# Patient Record
Sex: Female | Born: 1955 | Race: White | Hispanic: No | Marital: Married | Smoking: Current every day smoker
Health system: Southern US, Community
[De-identification: ages and names within clinical notes are randomized; demographics above are authoritative.]

## PROBLEM LIST (undated history)

## (undated) DIAGNOSIS — Z6841 Body Mass Index (BMI) 40.0 and over, adult: Secondary | ICD-10-CM

## (undated) DIAGNOSIS — F4321 Adjustment disorder with depressed mood: Secondary | ICD-10-CM

## (undated) DIAGNOSIS — Z8701 Personal history of pneumonia (recurrent): Secondary | ICD-10-CM

## (undated) DIAGNOSIS — I5032 Chronic diastolic (congestive) heart failure: Secondary | ICD-10-CM

## (undated) DIAGNOSIS — R0683 Snoring: Secondary | ICD-10-CM

## (undated) HISTORY — DX: Snoring: R06.83

## (undated) HISTORY — DX: Body Mass Index (BMI) 40.0 and over, adult: Z684

## (undated) HISTORY — DX: Chronic diastolic (congestive) heart failure: I50.32

## (undated) HISTORY — DX: Adjustment disorder with depressed mood: F43.21

## (undated) HISTORY — DX: Personal history of pneumonia (recurrent): Z87.01

---

## 2017-04-08 DIAGNOSIS — E559 Vitamin D deficiency, unspecified: Secondary | ICD-10-CM | POA: Diagnosis not present

## 2017-04-08 DIAGNOSIS — Z23 Encounter for immunization: Secondary | ICD-10-CM | POA: Diagnosis not present

## 2017-04-08 DIAGNOSIS — E782 Mixed hyperlipidemia: Secondary | ICD-10-CM | POA: Diagnosis not present

## 2017-04-08 DIAGNOSIS — Z6835 Body mass index (BMI) 35.0-35.9, adult: Secondary | ICD-10-CM | POA: Diagnosis not present

## 2017-04-08 DIAGNOSIS — Z1389 Encounter for screening for other disorder: Secondary | ICD-10-CM | POA: Diagnosis not present

## 2017-04-08 DIAGNOSIS — Z0000 Encounter for general adult medical examination without abnormal findings: Secondary | ICD-10-CM | POA: Diagnosis not present

## 2017-04-08 DIAGNOSIS — E119 Type 2 diabetes mellitus without complications: Secondary | ICD-10-CM | POA: Diagnosis not present

## 2017-04-09 DIAGNOSIS — E119 Type 2 diabetes mellitus without complications: Secondary | ICD-10-CM | POA: Diagnosis not present

## 2017-04-09 DIAGNOSIS — Z0000 Encounter for general adult medical examination without abnormal findings: Secondary | ICD-10-CM | POA: Diagnosis not present

## 2017-04-09 DIAGNOSIS — E782 Mixed hyperlipidemia: Secondary | ICD-10-CM | POA: Diagnosis not present

## 2017-04-09 DIAGNOSIS — E559 Vitamin D deficiency, unspecified: Secondary | ICD-10-CM | POA: Diagnosis not present

## 2017-07-24 DIAGNOSIS — Z1231 Encounter for screening mammogram for malignant neoplasm of breast: Secondary | ICD-10-CM | POA: Diagnosis not present

## 2017-09-04 DIAGNOSIS — H68003 Unspecified Eustachian salpingitis, bilateral: Secondary | ICD-10-CM | POA: Diagnosis not present

## 2017-09-04 DIAGNOSIS — J309 Allergic rhinitis, unspecified: Secondary | ICD-10-CM | POA: Diagnosis not present

## 2017-09-04 DIAGNOSIS — H109 Unspecified conjunctivitis: Secondary | ICD-10-CM | POA: Diagnosis not present

## 2017-09-04 DIAGNOSIS — H81319 Aural vertigo, unspecified ear: Secondary | ICD-10-CM | POA: Diagnosis not present

## 2017-09-18 DIAGNOSIS — Z79899 Other long term (current) drug therapy: Secondary | ICD-10-CM | POA: Diagnosis not present

## 2017-09-18 DIAGNOSIS — E119 Type 2 diabetes mellitus without complications: Secondary | ICD-10-CM | POA: Diagnosis not present

## 2017-09-18 DIAGNOSIS — E559 Vitamin D deficiency, unspecified: Secondary | ICD-10-CM | POA: Diagnosis not present

## 2017-09-18 DIAGNOSIS — E669 Obesity, unspecified: Secondary | ICD-10-CM | POA: Diagnosis not present

## 2017-09-18 DIAGNOSIS — I10 Essential (primary) hypertension: Secondary | ICD-10-CM | POA: Diagnosis not present

## 2017-09-18 DIAGNOSIS — Z6835 Body mass index (BMI) 35.0-35.9, adult: Secondary | ICD-10-CM | POA: Diagnosis not present

## 2019-01-05 DIAGNOSIS — Z0001 Encounter for general adult medical examination with abnormal findings: Secondary | ICD-10-CM | POA: Diagnosis not present

## 2019-01-05 DIAGNOSIS — Z0000 Encounter for general adult medical examination without abnormal findings: Secondary | ICD-10-CM | POA: Diagnosis not present

## 2019-01-05 DIAGNOSIS — Z1331 Encounter for screening for depression: Secondary | ICD-10-CM | POA: Diagnosis not present

## 2019-01-05 DIAGNOSIS — Z1231 Encounter for screening mammogram for malignant neoplasm of breast: Secondary | ICD-10-CM | POA: Diagnosis not present

## 2019-01-05 DIAGNOSIS — F17210 Nicotine dependence, cigarettes, uncomplicated: Secondary | ICD-10-CM | POA: Diagnosis not present

## 2019-01-05 DIAGNOSIS — H1013 Acute atopic conjunctivitis, bilateral: Secondary | ICD-10-CM | POA: Diagnosis not present

## 2019-01-30 DIAGNOSIS — H01023 Squamous blepharitis right eye, unspecified eyelid: Secondary | ICD-10-CM | POA: Diagnosis not present

## 2019-02-13 DIAGNOSIS — H01023 Squamous blepharitis right eye, unspecified eyelid: Secondary | ICD-10-CM | POA: Diagnosis not present

## 2019-02-23 DIAGNOSIS — E039 Hypothyroidism, unspecified: Secondary | ICD-10-CM | POA: Diagnosis not present

## 2019-03-18 DIAGNOSIS — Z1231 Encounter for screening mammogram for malignant neoplasm of breast: Secondary | ICD-10-CM | POA: Diagnosis not present

## 2019-03-18 DIAGNOSIS — N959 Unspecified menopausal and perimenopausal disorder: Secondary | ICD-10-CM | POA: Diagnosis not present

## 2019-03-18 DIAGNOSIS — M8589 Other specified disorders of bone density and structure, multiple sites: Secondary | ICD-10-CM | POA: Diagnosis not present

## 2019-04-07 DIAGNOSIS — H0100B Unspecified blepharitis left eye, upper and lower eyelids: Secondary | ICD-10-CM | POA: Diagnosis not present

## 2019-04-07 DIAGNOSIS — Z6832 Body mass index (BMI) 32.0-32.9, adult: Secondary | ICD-10-CM | POA: Diagnosis not present

## 2019-04-07 DIAGNOSIS — E785 Hyperlipidemia, unspecified: Secondary | ICD-10-CM | POA: Diagnosis not present

## 2019-04-07 DIAGNOSIS — E039 Hypothyroidism, unspecified: Secondary | ICD-10-CM | POA: Diagnosis not present

## 2019-04-24 DIAGNOSIS — H5203 Hypermetropia, bilateral: Secondary | ICD-10-CM | POA: Diagnosis not present

## 2019-07-06 ENCOUNTER — Encounter: Payer: Self-pay | Admitting: Gastroenterology

## 2020-07-22 DIAGNOSIS — E039 Hypothyroidism, unspecified: Secondary | ICD-10-CM | POA: Diagnosis not present

## 2020-07-22 DIAGNOSIS — L853 Xerosis cutis: Secondary | ICD-10-CM | POA: Diagnosis not present

## 2020-07-22 DIAGNOSIS — E785 Hyperlipidemia, unspecified: Secondary | ICD-10-CM | POA: Diagnosis not present

## 2020-07-22 DIAGNOSIS — H1013 Acute atopic conjunctivitis, bilateral: Secondary | ICD-10-CM | POA: Diagnosis not present

## 2020-08-12 ENCOUNTER — Ambulatory Visit: Payer: BC Managed Care – PPO | Admitting: Sports Medicine

## 2020-08-12 ENCOUNTER — Other Ambulatory Visit: Payer: Self-pay

## 2020-08-12 ENCOUNTER — Encounter: Payer: Self-pay | Admitting: Sports Medicine

## 2020-08-12 DIAGNOSIS — L989 Disorder of the skin and subcutaneous tissue, unspecified: Secondary | ICD-10-CM | POA: Diagnosis not present

## 2020-08-12 DIAGNOSIS — B070 Plantar wart: Secondary | ICD-10-CM | POA: Diagnosis not present

## 2020-08-12 DIAGNOSIS — Q828 Other specified congenital malformations of skin: Secondary | ICD-10-CM

## 2020-08-12 DIAGNOSIS — M79671 Pain in right foot: Secondary | ICD-10-CM | POA: Diagnosis not present

## 2020-08-12 NOTE — Progress Notes (Signed)
Subjective: Lisa Navarro is a 65 y.o. female patient who presents to office for evaluation of Right foot pain that started after she stepped on a splinter and her husband pulled it out and afterwards developed a area of callus.  Patient reports that pain is worse when she has to walk at work and work her shift of more than 10 to 12 hours reports that she also needs new shoes.  Denies any redness warmth swelling or drainage from the area.  Admits to a history of hammertoes.  No other pedal complaints noted.  Review of systems noncontributory.  There are no problems to display for this patient.   Current Outpatient Medications on File Prior to Visit  Medication Sig Dispense Refill  . Atorvastatin Calcium (LIPITOR PO) Take by mouth.    . Montelukast Sodium (SINGULAIR PO) Take by mouth.    Marland Kitchen azelastine (OPTIVAR) 0.05 % ophthalmic solution Apply 1 drop to eye 2 (two) times daily.    Marland Kitchen levothyroxine (SYNTHROID) 150 MCG tablet Take 150 mcg by mouth every morning.     No current facility-administered medications on file prior to visit.    Allergies  Allergen Reactions  . Sulfa Antibiotics     Objective:  General: Alert and oriented x3 in no acute distress  Dermatology: Keratotic lesion present measuring less than 0.3 cm at ball of the right foot subsecond metatarsal with a nucleated core, pain is present with medial lateral pressure to the lesion, no capillaries or pin point bleeding noted, no webspace macerations, no ecchymosis bilateral, all nails x 10 are well manicured.  Vascular: Dorsalis Pedis and Posterior Tibial pedal pulses 1/4, Capillary Fill Time 3 seconds, no pedal hair growth bilateral, no edema bilateral lower extremities, Temperature gradient within normal limits.  Neurology: Gross sensation intact via light touch bilateral.  Musculoskeletal: Mild tenderness with palpation at the lesion site on Right, Muscular strength 5/5 in all groups without pain or limitation on range of  motion.  Hammertoes x10 bilateral.  Cavus foot type.  Assessment and Plan: Problem List Items Addressed This Visit   None   Visit Diagnoses    Porokeratosis    -  Primary   Benign skin lesion       Plantar wart       Right foot pain          -Complete examination performed -Discussed treatment options for keratosis which could represent a traumatized wart -Parred keratoic warty lesion using a chisel blade; treated the area with Catharidin covered with bandaid; Advised patient of blistering reaction that will occur from application of medication and once this happens replace bandaid with neosporin and tape/bandaid -Applied offloading padding to right shoe -Advised patient to get new tennis shoes -Advised patient if symptoms are doing better next visit we will discuss need for custom orthotics -Patient to return to office in 1 month or sooner if condition worsens.  Asencion Islam, DPM

## 2020-08-23 DIAGNOSIS — Z1231 Encounter for screening mammogram for malignant neoplasm of breast: Secondary | ICD-10-CM | POA: Diagnosis not present

## 2020-08-31 DIAGNOSIS — Z23 Encounter for immunization: Secondary | ICD-10-CM | POA: Diagnosis not present

## 2020-09-14 ENCOUNTER — Other Ambulatory Visit: Payer: Self-pay

## 2020-09-14 ENCOUNTER — Encounter: Payer: Self-pay | Admitting: Sports Medicine

## 2020-09-14 ENCOUNTER — Ambulatory Visit (INDEPENDENT_AMBULATORY_CARE_PROVIDER_SITE_OTHER): Payer: BC Managed Care – PPO | Admitting: Sports Medicine

## 2020-09-14 ENCOUNTER — Ambulatory Visit: Payer: BC Managed Care – PPO | Admitting: Sports Medicine

## 2020-09-14 DIAGNOSIS — Q828 Other specified congenital malformations of skin: Secondary | ICD-10-CM

## 2020-09-14 DIAGNOSIS — L989 Disorder of the skin and subcutaneous tissue, unspecified: Secondary | ICD-10-CM

## 2020-09-14 DIAGNOSIS — M79671 Pain in right foot: Secondary | ICD-10-CM

## 2020-09-14 DIAGNOSIS — B070 Plantar wart: Secondary | ICD-10-CM

## 2020-09-14 NOTE — Progress Notes (Signed)
Subjective: Lisa Navarro is a 65 y.o. female patient who returns to office for evaluation of Right foot pain at area of keratosis.  Patient reports that she did have pain for the first few days but then the pain came back sharp shooting with pressure to the ball of the foot.  Reports that the padding tend to move around so she had a hard time using it.   There are no problems to display for this patient.   Current Outpatient Medications on File Prior to Visit  Medication Sig Dispense Refill  . Atorvastatin Calcium (LIPITOR PO) Take by mouth.    Marland Kitchen azelastine (OPTIVAR) 0.05 % ophthalmic solution Apply 1 drop to eye 2 (two) times daily.    Marland Kitchen levothyroxine (SYNTHROID) 150 MCG tablet Take 150 mcg by mouth every morning.    . Montelukast Sodium (SINGULAIR PO) Take by mouth.     No current facility-administered medications on file prior to visit.    Allergies  Allergen Reactions  . Sulfa Antibiotics     Objective:  General: Alert and oriented x3 in no acute distress  Dermatology: Keratotic lesion present measuring less than 0.3 cm at ball of the right foot subsecond metatarsal with a nucleated core, pain is present with medial lateral pressure to the lesion, no capillaries or pin point bleeding noted, no webspace macerations, no ecchymosis bilateral, all nails x 10 are well manicured.  Vascular: Dorsalis Pedis and Posterior Tibial pedal pulses 1/4, Capillary Fill Time 3 seconds, no pedal hair growth bilateral, no edema bilateral lower extremities, Temperature gradient within normal limits.  Neurology: Gross sensation intact via light touch bilateral.  Musculoskeletal: Mild tenderness with palpation at the lesion site on Right, Muscular strength 5/5 in all groups without pain or limitation on range of motion.  Hammertoes x10 bilateral.  Cavus foot type.  Assessment and Plan: Problem List Items Addressed This Visit   None   Visit Diagnoses    Porokeratosis    -  Primary   Benign skin  lesion       Plantar wart       Right foot pain          -Complete examination performed -Re-Discussed treatment options for keratosis which could represent a traumatized wart -Parred keratoic warty lesion using a chisel blade; treated the area with Catharidin covered with bandaid; Advised patient of blistering reaction that will occur from application of medication and once this happens replace bandaid with neosporin and tape/bandaid. This is treatment #2 -Continue with offloading padding may consider orthotics if pain is better -Patient to return to office in 1 month or sooner if condition worsens.  Asencion Islam, DPM

## 2020-09-14 NOTE — Patient Instructions (Signed)

## 2021-01-19 DIAGNOSIS — E559 Vitamin D deficiency, unspecified: Secondary | ICD-10-CM | POA: Diagnosis not present

## 2021-01-19 DIAGNOSIS — J449 Chronic obstructive pulmonary disease, unspecified: Secondary | ICD-10-CM | POA: Diagnosis not present

## 2021-01-19 DIAGNOSIS — E039 Hypothyroidism, unspecified: Secondary | ICD-10-CM | POA: Diagnosis not present

## 2021-01-19 DIAGNOSIS — E785 Hyperlipidemia, unspecified: Secondary | ICD-10-CM | POA: Diagnosis not present

## 2021-01-19 DIAGNOSIS — Z0000 Encounter for general adult medical examination without abnormal findings: Secondary | ICD-10-CM | POA: Diagnosis not present

## 2021-01-19 DIAGNOSIS — F17210 Nicotine dependence, cigarettes, uncomplicated: Secondary | ICD-10-CM | POA: Diagnosis not present

## 2021-03-01 DIAGNOSIS — Z6835 Body mass index (BMI) 35.0-35.9, adult: Secondary | ICD-10-CM | POA: Diagnosis not present

## 2021-03-01 DIAGNOSIS — J449 Chronic obstructive pulmonary disease, unspecified: Secondary | ICD-10-CM | POA: Diagnosis not present

## 2021-03-01 DIAGNOSIS — F17210 Nicotine dependence, cigarettes, uncomplicated: Secondary | ICD-10-CM | POA: Diagnosis not present

## 2021-06-26 DIAGNOSIS — J441 Chronic obstructive pulmonary disease with (acute) exacerbation: Secondary | ICD-10-CM | POA: Diagnosis not present

## 2021-06-26 DIAGNOSIS — F17219 Nicotine dependence, cigarettes, with unspecified nicotine-induced disorders: Secondary | ICD-10-CM | POA: Diagnosis not present

## 2021-06-28 DIAGNOSIS — J441 Chronic obstructive pulmonary disease with (acute) exacerbation: Secondary | ICD-10-CM | POA: Diagnosis not present

## 2021-06-28 DIAGNOSIS — J849 Interstitial pulmonary disease, unspecified: Secondary | ICD-10-CM | POA: Diagnosis not present

## 2021-06-28 DIAGNOSIS — J449 Chronic obstructive pulmonary disease, unspecified: Secondary | ICD-10-CM | POA: Diagnosis not present

## 2021-06-28 DIAGNOSIS — F17219 Nicotine dependence, cigarettes, with unspecified nicotine-induced disorders: Secondary | ICD-10-CM | POA: Diagnosis not present

## 2021-07-03 DIAGNOSIS — F17219 Nicotine dependence, cigarettes, with unspecified nicotine-induced disorders: Secondary | ICD-10-CM | POA: Diagnosis not present

## 2021-07-03 DIAGNOSIS — J849 Interstitial pulmonary disease, unspecified: Secondary | ICD-10-CM | POA: Diagnosis not present

## 2021-07-03 DIAGNOSIS — J441 Chronic obstructive pulmonary disease with (acute) exacerbation: Secondary | ICD-10-CM | POA: Diagnosis not present

## 2021-07-18 DIAGNOSIS — H65191 Other acute nonsuppurative otitis media, right ear: Secondary | ICD-10-CM | POA: Diagnosis not present

## 2021-07-18 DIAGNOSIS — F17219 Nicotine dependence, cigarettes, with unspecified nicotine-induced disorders: Secondary | ICD-10-CM | POA: Diagnosis not present

## 2021-07-18 DIAGNOSIS — J849 Interstitial pulmonary disease, unspecified: Secondary | ICD-10-CM | POA: Diagnosis not present

## 2021-07-18 DIAGNOSIS — J449 Chronic obstructive pulmonary disease, unspecified: Secondary | ICD-10-CM | POA: Diagnosis not present

## 2021-07-27 ENCOUNTER — Ambulatory Visit: Payer: BC Managed Care – PPO | Admitting: Internal Medicine

## 2021-07-27 ENCOUNTER — Encounter: Payer: Self-pay | Admitting: Internal Medicine

## 2021-07-27 ENCOUNTER — Encounter: Payer: Self-pay | Admitting: *Deleted

## 2021-07-27 ENCOUNTER — Other Ambulatory Visit: Payer: Self-pay

## 2021-07-27 VITALS — BP 118/70 | HR 89 | Temp 98.0°F | Ht 65.5 in | Wt 226.8 lb

## 2021-07-27 DIAGNOSIS — R053 Chronic cough: Secondary | ICD-10-CM | POA: Diagnosis not present

## 2021-07-27 DIAGNOSIS — F17210 Nicotine dependence, cigarettes, uncomplicated: Secondary | ICD-10-CM | POA: Diagnosis not present

## 2021-07-27 DIAGNOSIS — R0609 Other forms of dyspnea: Secondary | ICD-10-CM | POA: Diagnosis not present

## 2021-07-27 DIAGNOSIS — Z8709 Personal history of other diseases of the respiratory system: Secondary | ICD-10-CM

## 2021-07-27 DIAGNOSIS — E669 Obesity, unspecified: Secondary | ICD-10-CM

## 2021-07-27 NOTE — Patient Instructions (Addendum)
ICD-10-CM   1. DOE (dyspnea on exertion)  R06.09     2. Chronic cough  R05.3     3. Smoking greater than 40 pack years  F17.210     4. History of acute bronchitis  Z87.09     5. 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

## 2021-07-27 NOTE — Progress Notes (Signed)
OV 07/27/2021  Subjective:  Patient ID: Lisa Navarro, female , DOB: 1955/10/22 , age 66 y.o. , MRN: [id number] , ADDRESS: [address] [place] [address] PCP Madison Hickman, FNP Patient Care Team: Madison Hickman, FNP as PCP - General (Family Medicine)  This Provider for this visit: Treatment Team:  Attending Provider: Brand Males, MD    07/27/2021 -   Chief Complaint  Patient presents with   Consult    Pt is here for eval of ILD/COPD.  Pt states that she has complaints of SOB that states can happen at anytime.     HPI Lisa Navarro 66 y.o. -it is unclear if she has ILD but she has been referred here because of chronic shortness of breath all made worse by recent respiratory viral infection.  She works in Psychologist, counselling.  She has to walk fair amount of distances.  Each distance that is greater than 150 feet.  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  She also is a chronic cough.  She is put this down to being obese, older than 64 years of age and also smoking history.  She is never really paid attention to this.  In the summer 2022 her physician gave her a inhaler but she did not use it.  Then around Thanksgiving 2022 she developed high fever and also worsening cough.  She rested and then got better but then got worse again.  Around June 16, 2021 she tested herself for COVID at [facility] and this was negative.  Then she started getting worse.  June 26, 2021 she saw primary care physician and was given 10 days of Augmentin and 5 days of prednisone.  After that she is significantly better.  Since then she has been taking Trelegy regularly.  Apparently her pulse ox was 60% on the finger on June 26, 2021.  Therefore primary care physician took her out of work.  Started on home oxygen.  She says she is substantially improved and almost back to baseline.  She  stopped using oxygen few weeks ago.  This is based on subjective response.  Here walking desaturation test she is shows a tendency to desaturate but is not really gone below 90%.  This is adequate.  She really wants to go back to work.  Currently when we did walking desaturation test she denied any chest pain or dizziness or diplopia or palpitations.  She only had some shortness of breath.    Symptoms:  SYMPTOM SCALE - ILD 07/27/2021  Current weight   O2 use ra  Shortness of Breath 0 -> 5 scale with 5 being worst (score 6 If unable to do)  At rest 3  Simple tasks - showers, clothes change, eating, shaving 3  Household (dishes, doing bed, laundry) 4  Shopping 4  Walking level at own pace 3  Walking up Stairs 6  Total (30-36) Dyspnea Score 23  How bad is your cough? 3  How bad is your fatigue 4  How bad is nausea 3  How bad is vomiting?  0  How bad is diarrhea? 0  How bad is anxiety? 0  How bad is depression 00  Any chronic pain - if so where and how bad 0       Past Medical History :  -Obesity Smoker - She believes she  might have COPD/asthma - History of pneumonia in 2015 - She did have COVID in October 2021.  She is fully vaccinated against COVID   ROS:  Review of system positive for shortness of breath and snoring and obesity  FAMILY HISTORY of LUNG DISEASE:  --Her sister has COPD.  Her brother has premature graying of the hair  PERSONAL EXPOSURE HISTORY:  -She has been smoking since 21 7520-25 cigarettes/day.  She still smokes.  Struggle to quit.  Never used marijuana.  No cocaine no intravenous drug use  HOME  EXPOSURE and HOBBY DETAILS :  [place]  Age of the current home is built in 7.  She does have a humidifier but no CPAP.  She does have a nebulizer machine.  Is no mold in it.  There is no mold in the house.  OCCUPATIONAL HISTORY (122 questions) : -She works in a Retail banker.   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  The production manufacturing plants that she is working have been dusty  PULMONARY TOXICITY HISTORY (27 items):  -None  INVESTIGATIONS: Simple office walk 185 feet x  3 laps goal with forehead probe 07/27/2021    O2 used ra   Number laps completed 3   Comments about pace avg   Resting Pulse Ox/HR 97% and 89/min   Final Pulse Ox/HR 90% and 122/min   Desaturated </= 88% no   Desaturated <= 3% points Yes, 7 points   Got Tachycardic >/= 90/min yes   Symptoms at end of test Mod dyspnea   Miscellaneous comments x       CT Chest data  No results found.    PFT  No flowsheet data found.     has no past medical history on file.   reports that she has been smoking cigarettes. She started smoking about 50 years ago. She has a 120.00 pack-year smoking history. She has never used smokeless tobacco.  No past surgical history on file.  Allergies  Allergen Reactions   Sulfa Antibiotics     Immunization History  Administered Date(s) Administered   Influenza, High Dose Seasonal PF 06/07/2021   Influenza-Unspecified 03/16/2018   Moderna Sars-Covid-2 Vaccination 09/25/2019, 10/21/2019, 08/31/2020    No family history on file.   Current Outpatient Medications:    Atorvastatin Calcium (LIPITOR PO), Take by mouth., Disp: , Rfl:    ipratropium-albuterol (DUONEB) 0.5-2.5 (3) MG/3ML SOLN, Take 3 mLs by nebulization every 4 (four) hours as needed., Disp: , Rfl:    levothyroxine (SYNTHROID) 150 MCG tablet, Take 150 mcg by mouth every morning., Disp: , Rfl:    Montelukast Sodium (SINGULAIR PO), Take by mouth., Disp: , Rfl:    TRELEGY ELLIPTA 100-62.5-25 MCG/ACT AEPB, Inhale 1 puff into the lungs daily., Disp: , Rfl:       Objective:   Vitals:   07/27/21 1528  BP: 118/70  Pulse: 89  Temp: 98 F (36.7 C)  TempSrc: Oral   SpO2: 97%  Weight: 226 lb 12.8 oz (102.9 kg)  Height: 5' 5.5" (1.664 m)    Estimated body mass index is 37.17 kg/m as calculated from the following:   Height as of this encounter: 5' 5.5" (1.664 m).   Weight as of this encounter: 226 lb 12.8 oz (102.9 kg).  @WEIGHTCHANGE @  [employer]  Weights   07/27/21 1528  Weight: 226 lb 12.8 oz (102.9 kg)     Physical Exam   General: No distress. Looks well. Obese smells of tobacco Neuro: Alert and Oriented x 3. GCS 15. Speech normal Psych: Pleasant Resp:  Barrel Chest - no.  Wheeze - no, Crackles - no, No overt respiratory distress CVS: Normal heart sounds. Murmurs - no Ext: Stigmata of Connective Tissue Disease - no HEENT: Normal upper airway. PEERL +. No post nasal drip        Assessment:       ICD-10-CM   1. DOE (dyspnea on exertion)  R06.09 CT Chest High Resolution    Pulmonary function test    ECHOCARDIOGRAM COMPLETE    2. Chronic cough  R05.3     3. Smoking greater than 40 pack years  F17.210     4. History of acute bronchitis  Z87.09     5. Obesity (BMI 30-39.9)  E66.9      Clinically she appears to be more at risk for COPD than evidence of pulmonary fibrosis but both conditions need to be ruled out.  Also she is eligible for lung cancer screening.  Therefore we will get a CT scan PFTs.  Her sister has COPD.  At follow-up we will even get an alpha-1 antitrypsin in case this emphysema diagnosed on CT and PFT.  Because she was able to walk 150 feet x 3 laps on room air without pulse ox dropping below 88% and did not have any symptoms other than her baseline shortness of breath I believe she can go back to work with self monitoring.  I.  I advised her to get a pulse ox monitor to self monitor and regulate herself.    Plan:     Patient Instructions     ICD-10-CM   1. DOE (dyspnea on exertion)  R06.09     2. Chronic cough  R05.3     3. Smoking greater than 40 pack years  F17.210     4. History of acute bronchitis   Z87.09     5. 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 Brand Males, M.D., F.C.C.P,  Pulmonary and Critical Care Medicine Staff Physician, [place] Director - Interstitial Lung Disease  Program  [facility] [place] [facility] [place], [place], [address]  Pager: [phone], If no answer or between  15:00h - 7:00h: call [phone] Telephone: [phone]  4:16 PM 07/27/2021

## 2021-08-03 ENCOUNTER — Other Ambulatory Visit: Payer: Self-pay

## 2021-08-03 ENCOUNTER — Ambulatory Visit (INDEPENDENT_AMBULATORY_CARE_PROVIDER_SITE_OTHER): Payer: BC Managed Care – PPO

## 2021-08-03 DIAGNOSIS — R0609 Other forms of dyspnea: Secondary | ICD-10-CM | POA: Diagnosis not present

## 2021-08-03 LAB — ECHOCARDIOGRAM COMPLETE
Area-P 1/2: 2.06 cm2
S' Lateral: 3 cm

## 2021-08-04 ENCOUNTER — Telehealth: Payer: Self-pay | Admitting: Internal Medicine

## 2021-08-04 NOTE — Telephone Encounter (Signed)
Rec'd fax from [employer] on 07/27/21 with disability form to be filled out.  Sent to [facility] for completion on 1/13 and provided to Dr. Marchelle Gearing on 1/23 for signature.  Need to include OV notes from 1/12 visit with signed form.

## 2021-08-15 NOTE — Telephone Encounter (Signed)
Forms signed and faxed to [employer]- patient notified. Copy placed in scan

## 2021-08-25 ENCOUNTER — Other Ambulatory Visit: Payer: Self-pay

## 2021-08-25 ENCOUNTER — Ambulatory Visit (INDEPENDENT_AMBULATORY_CARE_PROVIDER_SITE_OTHER)
Admission: RE | Admit: 2021-08-25 | Discharge: 2021-08-25 | Disposition: A | Discharge status: Home / Self Care | Payer: BC Managed Care – PPO | Source: Ambulatory Visit | Attending: Internal Medicine | Admitting: Internal Medicine

## 2021-08-25 DIAGNOSIS — J432 Centrilobular emphysema: Secondary | ICD-10-CM | POA: Diagnosis not present

## 2021-08-25 DIAGNOSIS — R0609 Other forms of dyspnea: Secondary | ICD-10-CM | POA: Diagnosis not present

## 2021-08-25 DIAGNOSIS — I700 Atherosclerosis of aorta: Secondary | ICD-10-CM | POA: Diagnosis not present

## 2021-08-25 DIAGNOSIS — I2510 Atherosclerotic heart disease of native coronary artery without angina pectoris: Secondary | ICD-10-CM | POA: Diagnosis not present

## 2021-08-25 DIAGNOSIS — J929 Pleural plaque without asbestos: Secondary | ICD-10-CM | POA: Diagnosis not present

## 2021-09-07 ENCOUNTER — Ambulatory Visit: Payer: BC Managed Care – PPO | Admitting: Adult Health

## 2021-09-13 ENCOUNTER — Encounter: Payer: Self-pay | Admitting: Adult Health

## 2021-09-13 ENCOUNTER — Ambulatory Visit: Payer: BC Managed Care – PPO | Admitting: Adult Health

## 2021-09-13 ENCOUNTER — Other Ambulatory Visit: Payer: Self-pay

## 2021-09-13 ENCOUNTER — Ambulatory Visit (INDEPENDENT_AMBULATORY_CARE_PROVIDER_SITE_OTHER): Payer: BC Managed Care – PPO | Admitting: Internal Medicine

## 2021-09-13 VITALS — BP 120/76 | HR 68 | Temp 97.7°F | Ht 64.5 in | Wt 226.0 lb

## 2021-09-13 DIAGNOSIS — Z8709 Personal history of other diseases of the respiratory system: Secondary | ICD-10-CM

## 2021-09-13 DIAGNOSIS — R0609 Other forms of dyspnea: Secondary | ICD-10-CM

## 2021-09-13 DIAGNOSIS — J432 Centrilobular emphysema: Secondary | ICD-10-CM

## 2021-09-13 DIAGNOSIS — F17210 Nicotine dependence, cigarettes, uncomplicated: Secondary | ICD-10-CM | POA: Diagnosis not present

## 2021-09-13 DIAGNOSIS — Z720 Tobacco use: Secondary | ICD-10-CM

## 2021-09-13 LAB — PULMONARY FUNCTION TEST
DL/VA % pred: 79 %
DL/VA: 3.32 ml/min/mmHg/L
DLCO cor % pred: 52 %
DLCO cor: 10.75 ml/min/mmHg
DLCO unc % pred: 52 %
DLCO unc: 10.75 ml/min/mmHg
FEF 25-75 Post: 0.94 L/sec
FEF 25-75 Pre: 0.94 L/sec
FEF2575-%Change-Post: 0 %
FEF2575-%Pred-Post: 41 %
FEF2575-%Pred-Pre: 41 %
FEV1-%Change-Post: 0 %
FEV1-%Pred-Post: 54 %
FEV1-%Pred-Pre: 55 %
FEV1-Post: 1.39 L
FEV1-Pre: 1.4 L
FEV1FVC-%Change-Post: 0 %
FEV1FVC-%Pred-Pre: 94 %
FEV6-%Change-Post: -1 %
FEV6-%Pred-Post: 59 %
FEV6-%Pred-Pre: 60 %
FEV6-Post: 1.88 L
FEV6-Pre: 1.91 L
FEV6FVC-%Pred-Post: 103 %
FEV6FVC-%Pred-Pre: 103 %
FVC-%Change-Post: -1 %
FVC-%Pred-Post: 57 %
FVC-%Pred-Pre: 58 %
FVC-Post: 1.88 L
FVC-Pre: 1.91 L
Post FEV1/FVC ratio: 74 %
Post FEV6/FVC ratio: 100 %
Pre FEV1/FVC ratio: 73 %
Pre FEV6/FVC Ratio: 100 %
RV % pred: 102 %
RV: 2.12 L
TLC % pred: 86 %
TLC: 4.43 L

## 2021-09-13 NOTE — Progress Notes (Signed)
@Patient  ID: , female    DOB: Feb 13, 1956, 66 y.o.   MRN: [id number]  Chief Complaint  Patient presents with   Follow-up    Referring provider: [id number], FNP  HPI: 66 year old female heavy smoker (120-pack-year history) seen for pulmonary consult July 27, 2021 for shortness of breath and evaluation of possible COPD.  TEST/EVENTS :  High-resolution CT chest August 25, 2021 is negative for ILD.  Mild emphysema, incidental finding of atherosclerosis and calcifications on the aortic valve 2D echo August 03, 2021 showed EF at 60-65%, grade 1 diastolic dysfunction, right ventricular systolic function is normal right ventricular size is normal aortic valve is normal no regurgitation and no or aortic valve stenosis. PFTs September 13, 2021 showed moderate restriction with an FEV1 at 54%, ratio 74, FVC 57%, no significant bronchodilator response, DLCO is at 52%  09/13/2021 Follow up : COPD with emphysema Patient returns for a 6-week follow-up.  Patient was seen last visit for pulmonary consult for COPD and shortness of breath.  Patient is a heavy smoker with over 120-pack-year history.  She is currently smoking 1.5 packs a day.  We discussed in detail smoking cessation.  She says she has no desire to quit smoking.  She says this is her only thing that she does and she is going to continue to smoke.  We did discuss cutting back on her smoking.  Down to at least a pack a day.  She says she would try to work on this.  Patient says overall she is very active.  She works full-time at a 11/13/2021.  She has been on Trelegy for a while.  She is unsure if she can tell much benefit from it however she denies any flare of cough or wheezing.  She usually has some cough worse in the morning when she gets up.  She does all of her cleaning shopping and she is fully independent.  She is not on oxygen.  Walk test last office visit showed no desaturations.  Patient was set up for  high-resolution CT chest that showed mild emphysema.  No interstitial lung disease.  There was atherosclerosis and calcifications of the aortic valve.  Patient did have an 2D echo in January that showed no valvular issues.  Patient was recommended to follow-up with her primary care provider regarding the atherosclerosis.  Patient had pulmonary function testing done this morning that showed moderate restriction with FEV1 at 54%, ratio 74, FVC 57% and no significant bronchodilator response.  DLCO was decreased at 52%. We discussed the lung cancer screening program..  She would be a candidate for next year. We discussed alpha-1 testing today.       Allergies  Allergen Reactions   Sulfa Antibiotics Rash    She breaks out.      Immunization History  Administered Date(s) Administered   Influenza, High Dose Seasonal PF 06/07/2021   Influenza-Unspecified 03/16/2018   Moderna Sars-Covid-2 Vaccination 09/25/2019, 10/21/2019, 08/31/2020    No past medical history on file.  Tobacco History: Social History   Tobacco Use  Smoking Status Every Day   Packs/day: 2.50   Years: 48.00   Pack years: 120.00   Types: Cigarettes   Start date: 1973  Smokeless Tobacco Never  Tobacco Comments   Currently smoking 1-2 packs per day as of 07/27/21 ep   Ready to quit: Not Answered Counseling given: Not Answered Tobacco comments: Currently smoking 1-2 packs per day as of 07/27/21 ep  Outpatient Medications Prior to Visit  Medication Sig Dispense Refill   Atorvastatin Calcium (LIPITOR PO) Take by mouth.     ipratropium-albuterol (DUONEB) 0.5-2.5 (3) MG/3ML SOLN Take 3 mLs by nebulization every 4 (four) hours as needed.     levothyroxine (SYNTHROID) 150 MCG tablet Take 150 mcg by mouth every morning.     Montelukast Sodium (SINGULAIR PO) Take by mouth.     TRELEGY ELLIPTA 100-62.5-25 MCG/ACT AEPB Inhale 1 puff into the lungs daily.     No facility-administered medications prior to visit.      Review of Systems:   Constitutional:   No  weight loss, night sweats,  Fevers, chills,  ]+fatigue, or  lassitude.  HEENT:   No headaches,  Difficulty swallowing,  Tooth/dental problems, or  Sore throat,                No sneezing, itching, ear ache, nasal congestion, post nasal drip,   CV:  No chest pain,  Orthopnea, PND, swelling in lower extremities, anasarca, dizziness, palpitations, syncope.   GI  No heartburn, indigestion, abdominal pain, nausea, vomiting, diarrhea, change in bowel habits, loss of appetite, bloody stools.   Resp:   No chest wall deformity  Skin: no rash or lesions.  GU: no dysuria, change in color of urine, no urgency or frequency.  No flank pain, no hematuria   MS:  No joint pain or swelling.  No decreased range of motion.  No back pain.    Physical Exam  BP 120/76 (BP Location: Left Arm, Patient Position: Sitting, Cuff Size: Large)    Pulse 68    Temp 97.7 F (36.5 C) (Oral)    Ht 5' 4.5" (1.638 m)    Wt 226 lb (102.5 kg)    SpO2 95%    BMI 38.19 kg/m   GEN: A/Ox3; pleasant , NAD, well nourished    HEENT:  [place]/AT,   NOSE-clear, THROAT-clear, no lesions, no postnasal drip or exudate noted.   NECK:  Supple w/ fair ROM; no JVD; normal carotid impulses w/o bruits; no thyromegaly or nodules palpated; no lymphadenopathy.    RESP  Clear  P & A; w/o, wheezes/ rales/ or rhonchi. no accessory muscle use, no dullness to percussion  CARD:  RRR, no m/r/g, no peripheral edema, pulses intact, no cyanosis or clubbing.  GI:   Soft & nt; nml bowel sounds; no organomegaly or masses detected.   Musco: Warm bil, no deformities or joint swelling noted.   Neuro: alert, no focal deficits noted.    Skin: Warm, no lesions or rashes    Lab Results:  CBC No results found for: WBC, RBC, HGB, HCT, PLT, MCV, MCH, MCHC, RDW, LYMPHSABS, MONOABS, EOSABS, BASOSABS  BMET No results found for: NA, K, CL, CO2, GLUCOSE, BUN, CREATININE, CALCIUM, GFRNONAA,  GFRAA  BNP No results found for: BNP  ProBNP No results found for: PROBNP  Imaging: CT Chest High Resolution  Result Date: 08/26/2021 CLINICAL DATA:  66 year old female with history of dyspnea on exertion. Chronic shortness of breath. History of COPD. EXAM: CT CHEST WITHOUT CONTRAST TECHNIQUE: Multidetector CT imaging of the chest was performed following the standard protocol without intravenous contrast. High resolution imaging of the lungs, as well as inspiratory and expiratory imaging, was performed. RADIATION DOSE REDUCTION: This exam was performed according to the departmental dose-optimization program which includes automated exposure control, adjustment of the mA and/or kV according to patient size and/or use of iterative reconstruction technique. COMPARISON:  No priors. FINDINGS:  Cardiovascular: Heart size is normal. There is no significant pericardial fluid, thickening or pericardial calcification. There is aortic atherosclerosis, as well as atherosclerosis of the great vessels of the mediastinum and the coronary arteries, including calcified atherosclerotic plaque in the left main, left anterior descending, left circumflex and right coronary arteries. Calcifications of the aortic valve. Mediastinum/Nodes: No pathologically enlarged mediastinal or hilar lymph nodes. Please note that accurate exclusion of hilar adenopathy is limited on noncontrast CT scans. Esophagus is unremarkable in appearance. No axillary lymphadenopathy. Lungs/Pleura: High-resolution images demonstrate no significant regions of ground-glass attenuation, septal thickening, subpleural reticulation, traction bronchiectasis or honeycombing to indicate interstitial lung disease. Inspiratory and expiratory imaging demonstrates very mild air trapping indicative of mild small airways disease. Diffuse bronchial wall thickening with mild centrilobular and paraseptal emphysema is also noted. A few scattered tiny 2-3 mm pulmonary nodules  are noted in the lungs bilaterally, nonspecific, but statistically likely benign. No other suspicious appearing pulmonary nodules or masses are noted. Upper Abdomen: Aortic atherosclerosis. Musculoskeletal: There are no aggressive appearing lytic or blastic lesions noted in the visualized portions of the skeleton. IMPRESSION: 1. No findings to suggest interstitial lung disease. 2. Mild diffuse bronchial wall thickening with mild centrilobular and paraseptal emphysema. There is also evidence of mild air trapping indicative of mild small airways disease. These findings are compatible with reported clinical history of COPD. 3. Aortic atherosclerosis, in addition to left main and three-vessel coronary artery disease. Please note that although the presence of coronary artery calcium documents the presence of coronary artery disease, the severity of this disease and any potential stenosis cannot be assessed on this non-gated CT examination. Assessment for potential risk factor modification, dietary therapy or pharmacologic therapy may be warranted, if clinically indicated. 4. There are calcifications of the aortic valve. Echocardiographic correlation for evaluation of potential valvular dysfunction may be warranted if clinically indicated. Aortic Atherosclerosis (ICD10-I70.0) and Emphysema (ICD10-J43.9). Electronically Signed   By: Trudie Reed M.D.   On: 08/26/2021 13:19      PFT Results Latest Ref Rng & Units 09/13/2021  FVC-Pre L 1.91  FVC-Predicted Pre % 58  FVC-Post L 1.88  FVC-Predicted Post % 57  Pre FEV1/FVC % % 73  Post FEV1/FCV % % 74  FEV1-Pre L 1.40  FEV1-Predicted Pre % 55  FEV1-Post L 1.39  DLCO uncorrected ml/min/mmHg 10.75  DLCO UNC% % 52  DLCO corrected ml/min/mmHg 10.75  DLCO COR %Predicted % 52  DLVA Predicted % 79  TLC L 4.43  TLC % Predicted % 86  RV % Predicted % 102    No results found for: NITRICOXIDE      Assessment & Plan:   No problem-specific Assessment & Plan  notes found for this encounter.     Rubye Oaks, NP 09/13/2021

## 2021-09-13 NOTE — Progress Notes (Signed)
PFT done today. 

## 2021-09-13 NOTE — Patient Instructions (Addendum)
Continue on Trelegy inhaler 1 puff daily , rinse after use .  ?Albuterol or Duoneb as needed ?Activity as tolerated .  ?Work on quitting [place] back on smoking  ?Refer to Lung cancer screening program. (Will need CT chest 08/2022)  ?Labs today  ?Follow up with Dr. Chase Caller or Tandi Hanko NP in 4 months and As needed   ? ? ? ? ?

## 2021-09-14 DIAGNOSIS — Z720 Tobacco use: Secondary | ICD-10-CM

## 2021-09-14 DIAGNOSIS — J439 Emphysema, unspecified: Secondary | ICD-10-CM | POA: Insufficient documentation

## 2021-09-14 HISTORY — DX: Emphysema, unspecified: J43.9

## 2021-09-14 HISTORY — DX: Tobacco use: Z72.0

## 2021-09-14 NOTE — Assessment & Plan Note (Signed)
Smoking cessation discussed in detail 

## 2021-09-14 NOTE — Assessment & Plan Note (Signed)
COPD with emphysema.  Patient has no significant airflow obstruction however she has significant restriction and decreased diffusing capacity.  Patient is encouraged on smoking cessation.  Refer to the lung cancer screening program for now we will continue on triple therapy maintenance inhaler. ?Check alpha-1 antitrypsin phenotype/level ? ?Plan  ?Patient Instructions  ?Continue on Trelegy inhaler 1 puff daily , rinse after use .  ?Albuterol or Duoneb as needed ?Activity as tolerated .  ?Work on quitting /cutting back on smoking  ?Refer to Lung cancer screening program. (Will need CT chest 08/2022)  ?Labs today  ?Follow up with Dr. Marchelle Gearing or Sherah Lund NP in 4 months and As needed   ? ? ? ? ?  ? ?

## 2021-09-23 LAB — ALPHA-1 ANTITRYPSIN PHENOTYPE: A-1 Antitrypsin, Ser: 148 mg/dL (ref 83–199)

## 2021-11-21 DIAGNOSIS — R0600 Dyspnea, unspecified: Secondary | ICD-10-CM | POA: Diagnosis not present

## 2021-11-21 DIAGNOSIS — J449 Chronic obstructive pulmonary disease, unspecified: Secondary | ICD-10-CM | POA: Diagnosis not present

## 2021-11-21 DIAGNOSIS — J189 Pneumonia, unspecified organism: Secondary | ICD-10-CM | POA: Diagnosis not present

## 2022-01-03 ENCOUNTER — Ambulatory Visit (INDEPENDENT_AMBULATORY_CARE_PROVIDER_SITE_OTHER): Payer: BC Managed Care – PPO

## 2022-01-03 ENCOUNTER — Encounter: Payer: Self-pay | Admitting: Adult Health

## 2022-01-03 ENCOUNTER — Ambulatory Visit: Payer: BC Managed Care – PPO | Admitting: Adult Health

## 2022-01-03 VITALS — BP 126/70 | HR 78 | Temp 97.8°F | Ht 65.0 in | Wt 227.0 lb

## 2022-01-03 DIAGNOSIS — Z720 Tobacco use: Secondary | ICD-10-CM

## 2022-01-03 DIAGNOSIS — J439 Emphysema, unspecified: Secondary | ICD-10-CM

## 2022-01-03 DIAGNOSIS — R0602 Shortness of breath: Secondary | ICD-10-CM | POA: Diagnosis not present

## 2022-01-03 DIAGNOSIS — F17210 Nicotine dependence, cigarettes, uncomplicated: Secondary | ICD-10-CM

## 2022-01-03 MED ORDER — BENZONATATE 200 MG PO CAPS: 200.0000 mg | ORAL_CAPSULE | Freq: Three times a day (TID) | ORAL | 1 refills | Status: DC | PRN

## 2022-01-03 NOTE — Assessment & Plan Note (Signed)
Smoking cessation discussed 

## 2022-01-03 NOTE — Patient Instructions (Addendum)
Chest xray today  Begin Allegra  At bedtime  for 1 week then As needed   Saline nasal spray Twice daily   Liquid Mucinex DM Twice daily  As needed  cough  Tessalon Three times a day  As needed  cough  Continue on Trelegy inhaler 1 puff daily , rinse after use .  Albuterol or Duoneb as needed Activity as tolerated .  Work on quitting /cutting back on smoking  Follow up with Dr. Marchelle Gearing or Burrell Hodapp NP in 4 months and As needed

## 2022-01-03 NOTE — Progress Notes (Signed)
@Patient  ID: , female    DOB: Nov 21, 1955, 66 y.o.   MRN: [id number]  Chief Complaint  Patient presents with   Follow-up    Referring provider: [id number], FNP  HPI: 66 year old female active smoker (over 120-pack-year history) seen for pulmonary consult July 27, 2021 for shortness of breath found to have COPD with emphysema  TEST/EVENTS :  High-resolution CT chest August 25, 2021 is negative for ILD.  Mild emphysema, incidental finding of atherosclerosis and calcifications on the aortic valve  2D echo August 03, 2021 showed EF at 60-65%, grade 1 diastolic dysfunction, right ventricular systolic function is normal right ventricular size is normal aortic valve is normal no regurgitation and no or aortic valve stenosis.  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  Patient has underlying COPD with emphysema.  She has a very heavy smoking history with over 120-pack-year history.  We discussed smoking cessation in detail. Has cut down to 1 PPD .  Patient was referred to the lung cancer screening program last visit.  Will not be due until Feb 2024 .  Complains over last 4 weeks had cough, congestion and wheezing . Seen at urgent care treated for COPD exacerbation and pneumonia. Started on Doxycycline and Augmentin , prednisone taper. Feeling better but still has cough.  Not using anything for cough . Has not tested for Mar 2024 . Eating well with no n/v/d. No hemoptysis.  Does have some nasal congestion and postnasal drainage. Remains on Trelegy inhaler daily.  Allergies  Allergen Reactions   Sulfa Antibiotics Rash    She breaks out.      Immunization History  Administered Date(s)  Administered   Influenza, High Dose Seasonal PF 06/07/2021   Influenza-Unspecified 03/16/2018   Moderna Sars-Covid-2 Vaccination 09/25/2019, 10/21/2019, 08/31/2020    History reviewed. No pertinent past medical history.  Tobacco History: Social History   Tobacco Use  Smoking Status Every Day   Packs/day: 2.50   Years: 48.00   Total pack years: 120.00   Types: Cigarettes   Start date: 1973  Smokeless Tobacco Never  Tobacco Comments   Currently smoking 1-2 packs per day as of 07/27/21 ep   Ready to quit: No Counseling given: Yes Tobacco comments: Currently smoking 1-2 packs per day as of 07/27/21 ep   Outpatient Medications Prior to Visit  Medication Sig Dispense Refill   levothyroxine (SYNTHROID) 150 MCG tablet Take 150 mcg by mouth every morning.     TRELEGY ELLIPTA 100-62.5-25 MCG/ACT AEPB Inhale 1 puff into the lungs daily.     Atorvastatin Calcium (LIPITOR PO) Take by mouth. (Patient not taking: Reported on 01/03/2022)     ipratropium-albuterol (DUONEB) 0.5-2.5 (3) MG/3ML SOLN Take 3 mLs by nebulization every 4 (four) hours as needed. (Patient not taking: Reported on 01/03/2022)     Montelukast Sodium (SINGULAIR PO) Take by mouth. (Patient not taking: Reported on 01/03/2022)     No facility-administered medications prior to visit.     Review of Systems:   Constitutional:   No  weight loss, night sweats,  Fevers, chills, fatigue, or  lassitude.  HEENT:   No headaches,  Difficulty swallowing,  Tooth/dental problems, or  Sore throat,  No sneezing, itching, ear ache,  +nasal congestion, post nasal drip,   CV:  No chest pain,  Orthopnea, PND, swelling in lower extremities, anasarca, dizziness, palpitations, syncope.   GI  No heartburn, indigestion, abdominal pain, nausea, vomiting, diarrhea, change in bowel habits, loss of appetite, bloody stools.   Resp: .  No chest wall deformity  Skin: no rash or lesions.  GU: no dysuria, change in color of urine, no  urgency or frequency.  No flank pain, no hematuria   MS:  No joint pain or swelling.  No decreased range of motion.  No back pain.    Physical Exam  BP 126/70 (BP Location: Left Arm, Cuff Size: Large)   Pulse 78   Temp 97.8 F (36.6 C) (Temporal)   Ht 5\' 5"  (1.651 m)   Wt 227 lb (103 kg)   SpO2 97%   BMI 37.77 kg/m   GEN: A/Ox3; pleasant , NAD, well nourished    HEENT:  [place]/AT, NOSE-clear, THROAT-clear, no lesions, no postnasal drip or exudate noted.   NECK:  Supple w/ fair ROM; no JVD; normal carotid impulses w/o bruits; no thyromegaly or nodules palpated; no lymphadenopathy.    RESP  Clear  P & A; w/o, wheezes/ rales/ or rhonchi. no accessory muscle use, no dullness to percussion  CARD:  RRR, no m/r/g, no peripheral edema, pulses intact, no cyanosis or clubbing.  GI:   Soft & nt; nml bowel sounds; no organomegaly or masses detected.   Musco: Warm bil, no deformities or joint swelling noted.   Neuro: alert, no focal deficits noted.    Skin: Warm, no lesions or rashes    Lab Results:  CBC No results found for: "WBC", "RBC", "HGB", "HCT", "PLT", "MCV", "MCH", "MCHC", "RDW", "LYMPHSABS", "MONOABS", "EOSABS", "BASOSABS"  BMET No results found for: "NA", "K", "CL", "CO2", "GLUCOSE", "BUN", "CREATININE", "CALCIUM", "GFRNONAA", "GFRAA"  BNP No results found for: "BNP"  ProBNP No results found for: "PROBNP"  Imaging: No results found.       Latest Ref Rng & Units 09/13/2021    8:28 AM  PFT Results  FVC-Pre L 1.91   FVC-Predicted Pre % 58   FVC-Post L 1.88   FVC-Predicted Post % 57   Pre FEV1/FVC % % 73   Post FEV1/FCV % % 74   FEV1-Pre L 1.40   FEV1-Predicted Pre % 55   FEV1-Post L 1.39   DLCO uncorrected ml/min/mmHg 10.75   DLCO UNC% % 52   DLCO corrected ml/min/mmHg 10.75   DLCO COR %Predicted % 52   DLVA Predicted % 79   TLC L 4.43   TLC % Predicted % 86   RV % Predicted % 102     No results found for: "NITRICOXIDE"      Assessment &  Plan:   COPD with emphysema (HCC) Recent COPD exacerbation seems to be improving.  She was treated for possible underlying pneumonia.  We will repeat chest x-ray to document clearance. Would continue on triple therapy maintenance inhaler.  Continue on trigger prevention.  Long discussion regarding smoking cessation.  Patient has cut down to 1 pack a day.  We discussed setting a new goal of three fourths of a pack upon next return.  Plan  Patient Instructions  Chest xray today  Begin Allegra  At bedtime  for 1 week then As needed   Saline nasal spray Twice daily   Liquid Mucinex DM Twice daily  As needed  cough  Tessalon Three times a  day  As needed  cough  Continue on Trelegy inhaler 1 puff daily , rinse after use .  Albuterol or Duoneb as needed Activity as tolerated .  Work on quitting /cutting back on smoking  Follow up with Dr. Marchelle Gearing or Amol Domanski NP in 4 months and As needed          Tobacco abuse Smoking cessation discussed     Rubye Oaks, NP 01/03/2022

## 2022-01-03 NOTE — Assessment & Plan Note (Signed)
Recent COPD exacerbation seems to be improving.  She was treated for possible underlying pneumonia.  We will repeat chest x-ray to document clearance. Would continue on triple therapy maintenance inhaler.  Continue on trigger prevention.  Long discussion regarding smoking cessation.  Patient has cut down to 1 pack a day.  We discussed setting a new goal of three fourths of a pack upon next return.  Plan  Patient Instructions  Chest xray today  Begin Allegra  At bedtime  for 1 week then As needed   Saline nasal spray Twice daily   Liquid Mucinex DM Twice daily  As needed  cough  Tessalon Three times a day  As needed  cough  Continue on Trelegy inhaler 1 puff daily , rinse after use .  Albuterol or Duoneb as needed Activity as tolerated .  Work on quitting /cutting back on smoking  Follow up with Dr. Marchelle Gearing or Latonyia Lopata NP in 4 months and As needed

## 2022-03-01 DIAGNOSIS — E559 Vitamin D deficiency, unspecified: Secondary | ICD-10-CM | POA: Diagnosis not present

## 2022-03-01 DIAGNOSIS — E782 Mixed hyperlipidemia: Secondary | ICD-10-CM | POA: Diagnosis not present

## 2022-03-01 DIAGNOSIS — E039 Hypothyroidism, unspecified: Secondary | ICD-10-CM | POA: Diagnosis not present

## 2022-03-01 DIAGNOSIS — Z79899 Other long term (current) drug therapy: Secondary | ICD-10-CM | POA: Diagnosis not present

## 2022-03-01 DIAGNOSIS — J45909 Unspecified asthma, uncomplicated: Secondary | ICD-10-CM | POA: Diagnosis not present

## 2022-03-01 DIAGNOSIS — Z6837 Body mass index (BMI) 37.0-37.9, adult: Secondary | ICD-10-CM | POA: Diagnosis not present

## 2022-03-01 DIAGNOSIS — E119 Type 2 diabetes mellitus without complications: Secondary | ICD-10-CM | POA: Diagnosis not present

## 2022-04-17 DIAGNOSIS — Z0000 Encounter for general adult medical examination without abnormal findings: Secondary | ICD-10-CM | POA: Diagnosis not present

## 2022-04-17 DIAGNOSIS — Z23 Encounter for immunization: Secondary | ICD-10-CM | POA: Diagnosis not present

## 2022-04-17 DIAGNOSIS — Z6837 Body mass index (BMI) 37.0-37.9, adult: Secondary | ICD-10-CM | POA: Diagnosis not present

## 2022-04-18 DIAGNOSIS — E039 Hypothyroidism, unspecified: Secondary | ICD-10-CM | POA: Diagnosis not present

## 2022-05-07 ENCOUNTER — Ambulatory Visit: Payer: BC Managed Care – PPO | Admitting: Adult Health

## 2022-05-07 ENCOUNTER — Encounter: Payer: Self-pay | Admitting: Adult Health

## 2022-05-07 DIAGNOSIS — J432 Centrilobular emphysema: Secondary | ICD-10-CM | POA: Diagnosis not present

## 2022-05-07 DIAGNOSIS — J310 Chronic rhinitis: Secondary | ICD-10-CM

## 2022-05-07 DIAGNOSIS — Z720 Tobacco use: Secondary | ICD-10-CM

## 2022-05-07 HISTORY — DX: Chronic rhinitis: J31.0

## 2022-05-07 NOTE — Patient Instructions (Addendum)
Continue on Trelegy inhaler 1 puff daily , rinse after use .  Albuterol or Duoneb as needed Allergra daily as needed  Activity as tolerated .  Covid booster and RSV vaccine as discussed.  Work on quitting /cutting back on smoking  Follow up with Dr. Chase Caller or Neosha Switalski NP in 6 months and As needed

## 2022-05-07 NOTE — Assessment & Plan Note (Addendum)
Smoking cessation discussed in detail. Has referral for lung cancer screening program.  CT will be due February 2024

## 2022-05-07 NOTE — Progress Notes (Signed)
@Patient  ID: , female    DOB: 11/26/1955, 66 y.o.   MRN: [id number]  Chief Complaint  Patient presents with   Follow-up    Referring provider: No ref. 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  Mild emphysema, incidental finding of atherosclerosis and calcifications on the aortic valve   2D echo August 03, 2021 showed EF at 60-65%, grade 1 diastolic dysfunction, right ventricular systolic function is normal right ventricular size is normal aortic valve is normal no regurgitation and no or aortic valve stenosis.   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  Patient has underlying COPD with emphysema.  She has a very heavy smoking history with over 120-pack-year history.  Patient is cutting back on her smoking.  She has gotten down to almost 1/2 pack of cigarettes daily..  She has been referred to the lung cancer screening program.  And will be due for CT chest in February 2024.    She remains on Trelegy inhaler daily. She did denies any flare of cough or wheezing.  No increased albuterol use. Flu shot up to date. Had Pneumonia vaccine 3 years .  Says overall she is doing well.  Allegra daily is working well.    Allergies  Allergen Reactions   Sulfa Antibiotics Rash    She breaks out.      Immunization History  Administered Date(s) Administered   Influenza, High Dose Seasonal PF 06/07/2021   Influenza,inj,Quad PF,6+ Mos 04/06/2022   Influenza-Unspecified  03/16/2018   Moderna Sars-Covid-2 Vaccination 09/25/2019, 10/21/2019, 08/31/2020    No past medical history on file.  Tobacco History: Social History   Tobacco Use  Smoking Status Every Day   Packs/day: 2.50   Years: 48.00   Total pack years: 120.00   Types: Cigarettes   Start date: 1973  Smokeless Tobacco Never  Tobacco Comments   Currently smoking 1/2-1 ppd.  Trying to slow down.  05/07/2022 hfb   Ready to quit: No Counseling given: Yes Tobacco comments: Currently smoking 1/2-1 ppd.  Trying to slow down.  05/07/2022 hfb   Outpatient Medications Prior to Visit  Medication Sig Dispense Refill   albuterol (VENTOLIN HFA) 108 (90 Base) MCG/ACT inhaler Inhale 2 puffs into the lungs every 6 (six) hours as needed for wheezing or shortness of breath.     fexofenadine (ALLEGRA) 180 MG tablet Take 180 mg by mouth daily.     furosemide (LASIX) 40 MG tablet Take 40 mg by mouth daily.     ipratropium-albuterol (DUONEB) 0.5-2.5 (3) MG/3ML SOLN Take 3 mLs by nebulization every 6 (six) hours as needed.     levothyroxine (SYNTHROID) 150 MCG tablet Take 150 mcg by mouth every morning.     losartan-hydrochlorothiazide (HYZAAR) 50-12.5 MG tablet Take 1 tablet by mouth daily.     Multiple Vitamin (MULTIVITAMIN) tablet Take 1 tablet by mouth daily.     potassium chloride (KLOR-CON M) 10 MEQ tablet Take 10 mEq by mouth daily.     Probiotic  Product (PROBIOTIC-10 PO) Take 2 tablets by mouth daily.     rosuvastatin (CRESTOR) 20 MG tablet Take 20 mg by mouth at bedtime.     TRELEGY ELLIPTA 100-62.5-25 MCG/ACT AEPB Inhale 1 puff into the lungs daily.     Vitamin D, Ergocalciferol, (DRISDOL) 1.25 MG (50000 UNIT) CAPS capsule Take 50,000 Units by mouth once a week.     Atorvastatin Calcium (LIPITOR PO) Take by mouth. (Patient not taking: Reported on 01/03/2022)     benzonatate (TESSALON) 200 MG capsule Take 1 capsule (200 mg total) by mouth 3 (three) times daily as needed for cough. 45 capsule 1    ipratropium-albuterol (DUONEB) 0.5-2.5 (3) MG/3ML SOLN Take 3 mLs by nebulization every 4 (four) hours as needed. (Patient not taking: Reported on 01/03/2022)     Montelukast Sodium (SINGULAIR PO) Take by mouth. (Patient not taking: Reported on 01/03/2022)     No facility-administered medications prior to visit.     Review of Systems:   Constitutional:   No  weight loss, night sweats,  Fevers, chills, fatigue, or  lassitude.  HEENT:   No headaches,  Difficulty swallowing,  Tooth/dental problems, or  Sore throat,                No sneezing, itching, ear ache,  +nasal congestion, post nasal drip,   CV:  No chest pain,  Orthopnea, PND, swelling in lower extremities, anasarca, dizziness, palpitations, syncope.   GI  No heartburn, indigestion, abdominal pain, nausea, vomiting, diarrhea, change in bowel habits, loss of appetite, bloody stools.   Resp:  No excess mucus, no productive cough,  No non-productive cough,  No coughing up of blood.  No change in color of mucus.  No wheezing.  No chest wall deformity  Skin: no rash or lesions.  GU: no dysuria, change in color of urine, no urgency or frequency.  No flank pain, no hematuria   MS:  No joint pain or swelling.  No decreased range of motion.  No back pain.    Physical Exam  BP 120/70 (BP Location: Left Arm, Patient Position: Sitting, Cuff Size: Large)   Pulse 87   Temp 97.9 F (36.6 C) (Oral)   Ht 5\' 5"  (1.651 m)   Wt 232 lb (105.2 kg)   SpO2 95%   BMI 38.61 kg/m   GEN: A/Ox3; pleasant , NAD, well nourished    HEENT:  [place]/AT,   NOSE-clear, THROAT-clear, no lesions, no postnasal drip or exudate noted. Class 3 MP airway   NECK:  Supple w/ fair ROM; no JVD; normal carotid impulses w/o bruits; no thyromegaly or nodules palpated; no lymphadenopathy.    RESP  Clear  P & A; w/o, wheezes/ rales/ or rhonchi. no accessory muscle use, no dullness to percussion  CARD:  RRR, no m/r/g, no peripheral edema, pulses intact, no cyanosis or  clubbing.  GI:   Soft & nt; nml bowel sounds; no organomegaly or masses detected.   Musco: Warm bil, no deformities or joint swelling noted.   Neuro: alert, no focal deficits noted.    Skin: Warm, no lesions or rashes    Lab Results:  CBC No results found for: "WBC", "RBC", "HGB", "HCT", "PLT", "MCV", "MCH", "MCHC", "RDW", "LYMPHSABS", "MONOABS", "EOSABS", "BASOSABS"  BMET No results found for: "NA", "K", "CL", "CO2", "GLUCOSE", "BUN", "CREATININE", "CALCIUM", "GFRNONAA", "GFRAA"  BNP No results found for: "BNP"  ProBNP No results found for: "PROBNP"  Imaging: No results found.       Latest  Ref Rng & Units 09/13/2021    8:28 AM  PFT Results  FVC-Pre L 1.91   FVC-Predicted Pre % 58   FVC-Post L 1.88   FVC-Predicted Post % 57   Pre FEV1/FVC % % 73   Post FEV1/FCV % % 74   FEV1-Pre L 1.40   FEV1-Predicted Pre % 55   FEV1-Post L 1.39   DLCO uncorrected ml/min/mmHg 10.75   DLCO UNC% % 52   DLCO corrected ml/min/mmHg 10.75   DLCO COR %Predicted % 52   DLVA Predicted % 79   TLC L 4.43   TLC % Predicted % 86   RV % Predicted % 102     No results found for: "NITRICOXIDE"      Assessment & Plan:   COPD with emphysema (HCC) Well-controlled on Trelegy.  Continue all current regimen  Plan Patient Instructions  Continue on Trelegy inhaler 1 puff daily , rinse after use .  Albuterol or Duoneb as needed Allergra daily as needed  Activity as tolerated .  Covid booster and RSV vaccine as discussed.  Work on quitting /cutting back on smoking  Follow up with Dr. Marchelle Gearing or Evea Sheek NP in 6 months and As needed         Tobacco abuse Smoking cessation discussed in detail. Has referral for lung cancer screening program.  CT will be due February 2024  Chronic rhinitis Currently well controlled on Allegra.  May use saline nasal rinses as needed     Rubye Oaks, NP 05/07/2022

## 2022-05-07 NOTE — Assessment & Plan Note (Signed)
Currently well controlled on Allegra.  May use saline nasal rinses as needed

## 2022-05-07 NOTE — Assessment & Plan Note (Signed)
Well-controlled on Trelegy.  Continue all current regimen  Plan Patient Instructions  Continue on Trelegy inhaler 1 puff daily , rinse after use .  Albuterol or Duoneb as needed Allergra daily as needed  Activity as tolerated .  Covid booster and RSV vaccine as discussed.  Work on quitting /cutting back on smoking  Follow up with Dr. Chase Caller or Pritesh Sobecki NP in 6 months and As needed

## 2022-08-02 DIAGNOSIS — E559 Vitamin D deficiency, unspecified: Secondary | ICD-10-CM | POA: Diagnosis not present

## 2022-08-02 DIAGNOSIS — Z79899 Other long term (current) drug therapy: Secondary | ICD-10-CM | POA: Diagnosis not present

## 2022-08-02 DIAGNOSIS — E039 Hypothyroidism, unspecified: Secondary | ICD-10-CM | POA: Diagnosis not present

## 2022-08-02 DIAGNOSIS — E782 Mixed hyperlipidemia: Secondary | ICD-10-CM | POA: Diagnosis not present

## 2022-08-02 DIAGNOSIS — I10 Essential (primary) hypertension: Secondary | ICD-10-CM | POA: Diagnosis not present

## 2022-08-02 DIAGNOSIS — E119 Type 2 diabetes mellitus without complications: Secondary | ICD-10-CM | POA: Diagnosis not present

## 2022-08-10 DIAGNOSIS — Z1231 Encounter for screening mammogram for malignant neoplasm of breast: Secondary | ICD-10-CM | POA: Diagnosis not present

## 2022-09-28 ENCOUNTER — Other Ambulatory Visit: Payer: Self-pay | Admitting: *Deleted

## 2022-09-28 DIAGNOSIS — Z122 Encounter for screening for malignant neoplasm of respiratory organs: Secondary | ICD-10-CM

## 2022-09-28 DIAGNOSIS — F17210 Nicotine dependence, cigarettes, uncomplicated: Secondary | ICD-10-CM

## 2022-09-28 DIAGNOSIS — Z87891 Personal history of nicotine dependence: Secondary | ICD-10-CM

## 2022-10-24 ENCOUNTER — Ambulatory Visit (INDEPENDENT_AMBULATORY_CARE_PROVIDER_SITE_OTHER): Payer: BC Managed Care – PPO | Admitting: Acute Care

## 2022-10-24 ENCOUNTER — Encounter: Payer: Self-pay | Admitting: Acute Care

## 2022-10-24 DIAGNOSIS — F17210 Nicotine dependence, cigarettes, uncomplicated: Secondary | ICD-10-CM

## 2022-10-24 NOTE — Patient Instructions (Signed)
Thank you for participating in the [facility]. It was our pleasure to meet you today. We will call you with the results of your scan within the next few days. Your scan will be assigned a Lung RADS category score by the physicians reading the scans.  This Lung RADS score determines follow up scanning.  See below for description of categories, and follow up screening recommendations. We will be in touch to schedule your follow up screening annually or based on recommendations of our providers. We will fax a copy of your scan results to your Primary Care Physician, or the physician who referred you to the program, to ensure they have the results. Please call the office if you have any questions or concerns regarding your scanning experience or results.  Our office number is [phone]. Please speak with Denise Phelps, RN. , or  Denise Buckner RN, They are  our Lung Cancer Screening RN.'s If They are unavailable when you call, Please leave a message on the voice mail. We will return your call at our earliest convenience.This voice mail is monitored several times a day.  Remember, if your scan is normal, we will scan you annually as long as you continue to meet the criteria for the program. (Age 50-80, Current smoker or smoker who has quit within the last 15 years). If you are a smoker, remember, quitting is the single most powerful action that you can take to decrease your risk of lung cancer and other pulmonary, breathing related problems. We know quitting is hard, and we are here to help.  Please let us know if there is anything we can do to help you meet your goal of quitting. If you are a former smoker, congratulations. We are proud of you! Remain smoke free! Remember you can refer friends or family members through the number above.  We will screen them to make sure they meet criteria for the program. Thank you for helping us take better care of you by  participating in Lung Screening.  You can receive free nicotine replacement therapy ( patches, gum or mints) by calling 1-800-QUIT NOW. Please call so we can get you on the path to becoming  a non-smoker. I know it is hard, but you can do this!  Lung RADS Categories:  Lung RADS 1: no nodules or definitely non-concerning nodules.  Recommendation is for a repeat annual scan in 12 months.  Lung RADS 2:  nodules that are non-concerning in appearance and behavior with a very low likelihood of becoming an active cancer. Recommendation is for a repeat annual scan in 12 months.  Lung RADS 3: nodules that are probably non-concerning , includes nodules with a low likelihood of becoming an active cancer.  Recommendation is for a 6-month repeat screening scan. Often noted after an upper respiratory illness. We will be in touch to make sure you have no questions, and to schedule your 6-month scan.  Lung RADS 4 A: nodules with concerning findings, recommendation is most often for a follow up scan in 3 months or additional testing based on our provider's assessment of the scan. We will be in touch to make sure you have no questions and to schedule the recommended 3 month follow up scan.  Lung RADS 4 B:  indicates findings that are concerning. We will be in touch with you to schedule additional diagnostic testing based on our provider's  assessment of the scan.  Other options for assistance in smoking cessation (   As covered by your insurance benefits)  Hypnosis for smoking cessation  [employer] [phone]  Acupuncture for smoking cessation  [facility] [phone]   

## 2022-10-24 NOTE — Progress Notes (Signed)
Virtual Visit via Telephone Note  I connected with Lisa Navarro on 10/24/22 at 11:30 AM EDT by telephone and verified that I am speaking with the correct person using two identifiers.  Location: Patient:  Lisa Navarro Provider: [address] [place], [place], [address]    I discussed the limitations, risks, security and privacy concerns of performing an evaluation and management service by telephone and the availability of in person appointments. I also discussed with the patient that there may be a patient responsible charge related to this service. The patient expressed understanding and agreed to proceed.     Shared Decision Making Visit Lung Cancer Screening Program [phone])   Eligibility: Age 67 y.o. Pack Years Smoking History Calculation 100 pack year smoking history (# packs/per year x # years smoked) Recent History of coughing up blood  no Unexplained weight loss? no ( >Than 15 pounds within the last 6 months ) Prior History Lung / other cancer no (Diagnosis within the last 5 years already requiring surveillance chest CT Scans). Smoking Status Current Smoker Former Smokers: Years since quit: NA  Quit Date: NA  Visit Components: Discussion included one or more decision making aids. yes Discussion included risk/benefits of screening. yes Discussion included potential follow up diagnostic testing for abnormal scans. yes Discussion included meaning and risk of over diagnosis. yes Discussion included meaning and risk of False Positives. yes Discussion included meaning of total radiation exposure. yes  Counseling Included: Importance of adherence to annual lung cancer LDCT screening. yes Impact of comorbidities on ability to participate in the program. yes Ability and willingness to under diagnostic treatment. yes  Smoking Cessation Counseling: Current Smokers:  Discussed importance of smoking cessation. yes Information about tobacco cessation classes and  interventions provided to patient. yes Patient provided with "ticket" for LDCT Scan. yes Symptomatic Patient. no  Counseling NA Diagnosis Code: Tobacco Use Z72.0 Asymptomatic Patient yes  Counseling (Intermediate counseling: > three minutes counseling) B5830 Former Smokers:  Discussed the importance of maintaining cigarette abstinence. yes Diagnosis Code: Personal History of Nicotine Dependence. N40.768 Information about tobacco cessation classes and interventions provided to patient. Yes Patient provided with "ticket" for LDCT Scan. yes Written Order for Lung Cancer Screening with LDCT placed in Epic. Yes (CT Chest Lung Cancer Screening Low Dose W/O CM) [id number] Z12.2-Screening of respiratory organs Z87.891-Personal history of nicotine dependence  I have spent 25 minutes of face to face/ virtual visit   time with  Lisa Navarro discussing the risks and benefits of lung cancer screening. We viewed / discussed a power point together that explained in detail the above noted topics. We paused at intervals to allow for questions to be asked and answered to ensure understanding.We discussed that the single most powerful action that she can take to decrease her risk of developing lung cancer is to quit smoking. We discussed whether or not she is ready to commit to setting a quit date. We discussed options for tools to aid in quitting smoking including nicotine replacement therapy, non-nicotine medications, support groups, Quit Smart classes, and behavior modification. We discussed that often times setting smaller, more achievable goals, such as eliminating 1 cigarette a day for a week and then 2 cigarettes a day for a week can be helpful in slowly decreasing the number of cigarettes smoked. This allows for a sense of accomplishment as well as providing a clinical benefit. I provided  her  with smoking cessation  information  with contact information for community resources, classes, free  nicotine replacement  therapy, and access to mobile apps, text messaging, and on-line smoking cessation help. I have also provided  her  the office contact information in the event she needs to contact me, or the screening staff. We discussed the time and location of the scan, and that either Abigail Miyamoto RN, Karlton Lemon, RN  or I will call / send a letter with the results within 24-72 hours of receiving them. The patient verbalized understanding of all of  the above and had no further questions upon leaving the office. They have my contact information in the event they have any further questions.  I spent 3-4 minutes counseling on smoking cessation and the health risks of continued tobacco abuse.  I explained to the patient that there has been a high incidence of coronary artery disease noted on these exams. I explained that this is a non-gated exam therefore degree or severity cannot be determined. This patient is on statin therapy. I have asked the patient to follow-up with their PCP regarding any incidental finding of coronary artery disease and management with diet or medication as their PCP  feels is clinically indicated. The patient verbalized understanding of the above and had no further questions upon completion of the visit.      Lisa Ngo, NP 10/24/2022

## 2022-10-25 DIAGNOSIS — F17210 Nicotine dependence, cigarettes, uncomplicated: Secondary | ICD-10-CM | POA: Diagnosis not present

## 2022-10-25 DIAGNOSIS — Z87891 Personal history of nicotine dependence: Secondary | ICD-10-CM | POA: Diagnosis not present

## 2022-11-08 DIAGNOSIS — J309 Allergic rhinitis, unspecified: Secondary | ICD-10-CM | POA: Diagnosis not present

## 2022-11-08 DIAGNOSIS — E119 Type 2 diabetes mellitus without complications: Secondary | ICD-10-CM | POA: Diagnosis not present

## 2022-11-08 DIAGNOSIS — E782 Mixed hyperlipidemia: Secondary | ICD-10-CM | POA: Diagnosis not present

## 2022-11-08 DIAGNOSIS — I10 Essential (primary) hypertension: Secondary | ICD-10-CM | POA: Diagnosis not present

## 2022-11-30 ENCOUNTER — Encounter: Payer: Self-pay | Admitting: Internal Medicine

## 2022-11-30 ENCOUNTER — Ambulatory Visit (INDEPENDENT_AMBULATORY_CARE_PROVIDER_SITE_OTHER): Payer: BC Managed Care – PPO | Admitting: Internal Medicine

## 2022-11-30 VITALS — BP 132/72 | HR 88 | Temp 98.3°F | Ht 65.0 in | Wt 235.4 lb

## 2022-11-30 DIAGNOSIS — R0609 Other forms of dyspnea: Secondary | ICD-10-CM | POA: Diagnosis not present

## 2022-11-30 DIAGNOSIS — J449 Chronic obstructive pulmonary disease, unspecified: Secondary | ICD-10-CM

## 2022-11-30 DIAGNOSIS — F17210 Nicotine dependence, cigarettes, uncomplicated: Secondary | ICD-10-CM | POA: Diagnosis not present

## 2022-11-30 DIAGNOSIS — Z122 Encounter for screening for malignant neoplasm of respiratory organs: Secondary | ICD-10-CM

## 2022-11-30 DIAGNOSIS — Z7185 Encounter for immunization safety counseling: Secondary | ICD-10-CM

## 2022-11-30 MED ORDER — TRELEGY ELLIPTA 200-62.5-25 MCG/ACT IN AEPB: 1.0000 | INHALATION_SPRAY | Freq: Every day | RESPIRATORY_TRACT | 0 refills | Status: DC

## 2022-11-30 MED ORDER — TRELEGY ELLIPTA 100-62.5-25 MCG/ACT IN AEPB: 1.0000 | INHALATION_SPRAY | Freq: Every day | RESPIRATORY_TRACT | 3 refills | Status: DC

## 2022-11-30 NOTE — Patient Instructions (Addendum)
ICD-10-CM   1. Stage 2 moderate COPD by GOLD classification (HCC)  J44.9     2. Cigarette smoker  F17.210     3. Screening for malignant neoplasm of respiratory organ  Z12.2     4. DOE (dyspnea on exertion)  R06.09     5. Vaccine counseling  Z71.85       Clinically stable with stable shortness of breath LAST Ct chest feb 2023 On trelegy but need refill According to history [facility] CT scan recently was without lung cancer.  Plan Continue Trelegy  - take sample and also 90 day refills - continue albuterol as needed -get RSV vaccine  - QUIT SMOKING    Followup == -r eturn to see APP in 9 months or sooenr if needed

## 2022-11-30 NOTE — Progress Notes (Signed)
OV 07/27/2021  Subjective:  Patient ID: Lisa Navarro, female , DOB: Apr 10, 1956 , age 67 y.o. , MRN: [id number] , ADDRESS: [address] [place] [address] PCP Jerrye Bushy, FNP Patient Care Team: Jerrye Bushy, FNP as PCP - General (Family Medicine)  This Provider for this visit: Treatment Team:  Attending Provider: Kalman Shan, MD    07/27/2021 -   Chief Complaint  Patient presents with   Consult    Pt is here for eval of ILD/COPD.  Pt states that she has complaints of SOB that states can happen at anytime.     HPI Lisa Navarro 67 y.o. -it is unclear if she has ILD but she has been referred here because of chronic shortness of breath all made worse by recent respiratory viral infection.  She works in Financial trader.  She has to walk fair amount of distances.  Each distance that is greater than 150 feet.  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  She also is a chronic cough.  She is put this down to being obese, older than 65 years of age and also smoking history.  She is never really paid attention to this.  In the summer 2022 her physician gave her a inhaler but she did not use it.  Then around Thanksgiving 2022 she developed high fever and also worsening cough.  She rested and then got better but then got worse again.  Around June 16, 2021 she tested herself for COVID [facility] and this was negative.  Then she started getting worse.  June 26, 2021 she saw primary care physician and was given 10 days of Augmentin and 5 days of prednisone.  After that she is significantly better.  Since then she has been taking Trelegy regularly.  Apparently her pulse ox was 60% on the finger on June 26, 2021.  Therefore primary care physician took her out of work.  Started on home oxygen.  She says she is substantially improved and almost back to baseline.  She  stopped using oxygen few weeks ago.  This is based on subjective response.  Here walking desaturation test she is shows a tendency to desaturate but is not really gone below 90%.  This is adequate.  She really wants to go back to work.  Currently when we did walking desaturation test she denied any chest pain or dizziness or diplopia or palpitations.  She only had some shortness of breath.       Past Medical History :  -Obesity Smoker - She believes she might have COPD/asthma - History of pneumonia in 2015 - She did have COVID in October 2021.  She is fully vaccinated against COVID   ROS:  Review of system positive for shortness of breath and snoring and obesity  FAMILY HISTORY of LUNG DISEASE:  --Her sister has COPD.  Her brother has premature graying of the hair  PERSONAL EXPOSURE HISTORY:  -She has been smoking since 21 7520-25 cigarettes/day.  She still smokes.  Struggle to quit.  Never used marijuana.  No cocaine no intravenous drug use  HOME  EXPOSURE and HOBBY DETAILS :  [place]  Age of the current home is built in 13.  She does have a humidifier but no CPAP.  She does  have a nebulizer machine.  Is no mold in it.  There is no mold in the house.  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  Patient has underlying COPD  with emphysema.  She has a very heavy smoking history with over 120-pack-year history.  We discussed smoking cessation in detail. Has cut down to 1 PPD .  Patient was referred to the lung cancer screening program last visit.  Will not be due until Feb 2024 .  Complains over last 4 weeks had cough, congestion and wheezing . Seen at urgent care treated for COPD exacerbation and pneumonia. Started on Doxycycline and Augmentin , prednisone taper. Feeling better but still has cough.  Not using anything for cough . Has not tested for [employer] . Eating well with no n/v/d. No hemoptysis.  Does have some nasal congestion and postnasal drainage. Remains on Trelegy inhaler daily.  TEST/EVENTS :  High-resolution CT chest August 25, 2021 is negative for ILD.  Mild emphysema, incidental finding of atherosclerosis and calcifications on the aortic valve  2D echo August 03, 2021 showed EF at 60-65%, grade 1 diastolic dysfunction, right ventricular systolic function is normal right ventricular size is normal aortic valve is normal no regurgitation and no or aortic valve stenosis.  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 , MRN: [id number] , ADDRESS: [address] [place] [address] PCP Lucianne Lei, MD Patient Care Team: Lucianne Lei, MD as PCP - General (Internal Medicine)  This Provider for this visit: Treatment Team:  Attending Provider: Kalman Shan, MD    11/30/2022 -   Chief Complaint  Patient presents with   Follow-up    Doing well.  No sx noted.   ##Gold stage II COPD -on Trelegy #Morbid obesity- continue trelegy daily scheduled #Active smoking; history greater than 100 pack #Lung cancer screening  -per his statement of CT chest 2024 is without lung cancer.   HPI Lisa Navarro 67 y.o. -I originally saw her as dyspnea workup in January 2023.  After that confirmed to have Gold stage II COPD and saw nurse practitioner.  She is on Trelegy.  She is here today because the Trelegy needs refill but otherwise she is doing well.  Shortness of breath is stable.  She says she had a CT scan at [place] .  thought she did not and ordered it but later CMA told me that she has had a CT and therefore we canceled it.  This does not have lung cancer.  No real complaints.  We discussed quitting smoking.  She says she has tried but she struggles to.  She rather do it on her own.  We discussed about taking quit smoking drugs but she does not want to at this point.    Symptoms:  SYMPTOM SCALE - ILD 07/27/2021  Current weight   O2 use ra  Shortness of Breath 0 -> 5 scale with 5 being worst (score 6 If unable to do)  At rest 3  Simple tasks - showers, clothes change, eating, shaving 3  Household (dishes, doing bed, laundry) 4  Shopping 4  Walking level at own pace 3  Walking up Stairs 6  Total (30-36) Dyspnea Score 23  How bad is your cough? 3  How bad is your fatigue 4  How bad is nausea 3  How bad is vomiting?  0  How bad is diarrhea? 0  How bad is anxiety? 0  How bad is depression 00  Any chronic pain - if so where and how bad 0    Simple office walk 185 feet x  3 laps goal with forehead probe 07/27/2021    O2 used ra   Number laps completed 3   Comments about pace avg   Resting Pulse Ox/HR 97% and 89/min   Final Pulse Ox/HR 90% and 122/min   Desaturated </= 88% no   Desaturated <= 3% points Yes, 7 points   Got Tachycardic >/= 90/min yes   Symptoms at end of test Mod dyspnea   Miscellaneous comments x     PFT     Latest Ref Rng & Units 09/13/2021    8:28 AM  PFT Results  FVC-Pre L 1.91   FVC-Predicted Pre % 58   FVC-Post L 1.88   FVC-Predicted Post % 57   Pre FEV1/FVC % % 73    Post FEV1/FCV % % 74   FEV1-Pre L 1.40   FEV1-Predicted Pre % 55   FEV1-Post L 1.39   DLCO uncorrected ml/min/mmHg 10.75   DLCO UNC% % 52   DLCO corrected ml/min/mmHg 10.75   DLCO COR %Predicted % 52   DLVA Predicted % 79   TLC L 4.43   TLC % Predicted % 86   RV % Predicted % 102        has no past medical history on file.   reports that she has been smoking cigarettes. She started smoking about 51 years ago. She has a 100.00 pack-year smoking history. She has never used smokeless tobacco.  No past surgical history on file.  Allergies  Allergen Reactions   Sulfa Antibiotics Rash    She breaks out.      Immunization History  Administered Date(s) Administered   Influenza, High Dose Seasonal PF 06/07/2021   Influenza,inj,Quad PF,6+ Mos 04/06/2022   Influenza-Unspecified 03/16/2018   Moderna Sars-Covid-2 Vaccination 09/25/2019, 10/21/2019, 08/31/2020    No family history on file.   Current Outpatient Medications:    albuterol (VENTOLIN HFA) 108 (90 Base) MCG/ACT inhaler, Inhale 2 puffs into the lungs every 6 (six) hours as needed for wheezing or shortness of breath., Disp: , Rfl:    fexofenadine (ALLEGRA) 180 MG tablet, Take 180 mg by mouth daily., Disp: , Rfl:    fluticasone (FLONASE) 50 MCG/ACT nasal spray, Place 2 sprays into both nostrils daily., Disp: , Rfl:    Fluticasone-Umeclidin-Vilant (TRELEGY ELLIPTA) 100-62.5-25 MCG/ACT AEPB, Inhale 1 puff into the lungs daily., Disp: 28 each, Rfl: 3   Fluticasone-Umeclidin-Vilant (TRELEGY ELLIPTA) 200-62.5-25 MCG/ACT AEPB, Inhale 1 puff into the lungs daily., Disp: 14 each, Rfl: 0   furosemide (LASIX)  40 MG tablet, Take 40 mg by mouth daily., Disp: , Rfl:    ipratropium-albuterol (DUONEB) 0.5-2.5 (3) MG/3ML SOLN, Take 3 mLs by nebulization every 6 (six) hours as needed., Disp: , Rfl:    levothyroxine (SYNTHROID) 150 MCG tablet, Take 150 mcg by mouth every morning., Disp: , Rfl:    losartan-hydrochlorothiazide (HYZAAR)  50-12.5 MG tablet, Take 1 tablet by mouth daily., Disp: , Rfl:    montelukast (SINGULAIR) 10 MG tablet, Take 10 mg by mouth at bedtime., Disp: , Rfl:    Multiple Vitamin (MULTIVITAMIN) tablet, Take 1 tablet by mouth daily., Disp: , Rfl:    potassium chloride (KLOR-CON M) 10 MEQ tablet, Take 10 mEq by mouth daily., Disp: , Rfl:    Probiotic Product (PROBIOTIC-10 PO), Take 2 tablets by mouth daily., Disp: , Rfl:    rosuvastatin (CRESTOR) 20 MG tablet, Take 20 mg by mouth at bedtime., Disp: , Rfl:    TRELEGY ELLIPTA 100-62.5-25 MCG/ACT AEPB, Inhale 1 puff into the lungs daily., Disp: , Rfl:    Vitamin D, Ergocalciferol, (DRISDOL) 1.25 MG (50000 UNIT) CAPS capsule, Take 50,000 Units by mouth once a week., Disp: , Rfl:       Objective:   Vitals:   11/30/22 1411  BP: 132/72  Pulse: 88  Temp: 98.3 F (36.8 C)  TempSrc: Oral  SpO2: 92%  Weight: 235 lb 6.4 oz (106.8 kg)  Height: 5\' 5"  (1.651 m)    Estimated body mass index is 39.17 kg/m as calculated from the following:   Height as of this encounter: 5\' 5"  (1.651 m).   Weight as of this encounter: 235 lb 6.4 oz (106.8 kg).  @WEIGHTCHANGE @  Filed Weights   11/30/22 1411  Weight: 235 lb 6.4 oz (106.8 kg)     Physical Exam=   General: No distress. obese Neuro: Alert and Oriented x 3. GCS 15. Speech normal Psych: Pleasant Resp:  Barrel Chest - no.  Wheeze - no, Crackles - no, No overt respiratory distress CVS: Normal heart sounds. Murmurs - no Ext: Stigmata of Connective Tissue Disease - o HEENT: Normal upper airway. PEERL +. No post nasal drip        Assessment:       ICD-10-CM   1. Stage 2 moderate COPD by GOLD classification (HCC)  J44.9 CANCELED: CT Chest Wo Contrast    2. Cigarette smoker  F17.210     3. Screening for malignant neoplasm of respiratory organ  Z12.2     4. DOE (dyspnea on exertion)  R06.09     5. Vaccine counseling  Z71.85          Plan:     Patient Instructions     ICD-10-CM   1. Stage  2 moderate COPD by GOLD classification (HCC)  J44.9     2. Cigarette smoker  F17.210     3. Screening for malignant neoplasm of respiratory organ  Z12.2     4. DOE (dyspnea on exertion)  R06.09     5. Vaccine counseling  Z71.85       Clinically stable with stable shortness of breath LAST Ct chest feb 2023 On trelegy but need refill According to history [facility] CT scan recently was without lung cancer.  Plan Continue Trelegy  - take sample and also 90 day refills - continue albuterol as needed -get RSV vaccine  - QUIT SMOKING    Followup == -r eturn to see APP in 9 months or sooenr if needed  SIGNATURE    Dr. Kalman Shan, M.D., F.C.C.P,  Pulmonary and Critical Care Medicine Staff Physician, [facility] Director - Interstitial Lung Disease  Program  Pulmonary Fibrosis [facility] at [facility] [place], [place], [address]  Pager: [phone], If no answer or between  15:00h - 7:00h: call [phone] Telephone: [phone]  5:53 PM 11/30/2022

## 2023-03-05 DIAGNOSIS — Z79899 Other long term (current) drug therapy: Secondary | ICD-10-CM | POA: Diagnosis not present

## 2023-03-05 DIAGNOSIS — E782 Mixed hyperlipidemia: Secondary | ICD-10-CM | POA: Diagnosis not present

## 2023-03-05 DIAGNOSIS — E039 Hypothyroidism, unspecified: Secondary | ICD-10-CM | POA: Diagnosis not present

## 2023-03-05 DIAGNOSIS — E559 Vitamin D deficiency, unspecified: Secondary | ICD-10-CM | POA: Diagnosis not present

## 2023-03-05 DIAGNOSIS — I10 Essential (primary) hypertension: Secondary | ICD-10-CM | POA: Diagnosis not present

## 2023-04-07 ENCOUNTER — Other Ambulatory Visit: Payer: Self-pay | Admitting: Internal Medicine

## 2023-05-14 ENCOUNTER — Other Ambulatory Visit: Payer: Self-pay | Admitting: Internal Medicine

## 2023-06-06 DIAGNOSIS — Z0000 Encounter for general adult medical examination without abnormal findings: Secondary | ICD-10-CM | POA: Diagnosis not present

## 2023-06-06 DIAGNOSIS — E039 Hypothyroidism, unspecified: Secondary | ICD-10-CM | POA: Diagnosis not present

## 2023-06-06 DIAGNOSIS — R739 Hyperglycemia, unspecified: Secondary | ICD-10-CM | POA: Diagnosis not present

## 2023-06-06 DIAGNOSIS — J449 Chronic obstructive pulmonary disease, unspecified: Secondary | ICD-10-CM | POA: Diagnosis not present

## 2023-09-04 ENCOUNTER — Ambulatory Visit: Payer: BC Managed Care – PPO | Admitting: Adult Health

## 2023-09-12 ENCOUNTER — Ambulatory Visit: Payer: BC Managed Care – PPO | Admitting: Adult Health

## 2023-09-12 ENCOUNTER — Encounter: Payer: Self-pay | Admitting: Adult Health

## 2023-09-12 ENCOUNTER — Ambulatory Visit (INDEPENDENT_AMBULATORY_CARE_PROVIDER_SITE_OTHER): Payer: BC Managed Care – PPO

## 2023-09-12 VITALS — BP 136/64 | HR 102 | Ht 62.0 in | Wt 233.8 lb

## 2023-09-12 DIAGNOSIS — J9601 Acute respiratory failure with hypoxia: Secondary | ICD-10-CM

## 2023-09-12 DIAGNOSIS — I700 Atherosclerosis of aorta: Secondary | ICD-10-CM | POA: Diagnosis not present

## 2023-09-12 DIAGNOSIS — J441 Chronic obstructive pulmonary disease with (acute) exacerbation: Secondary | ICD-10-CM

## 2023-09-12 DIAGNOSIS — R509 Fever, unspecified: Secondary | ICD-10-CM

## 2023-09-12 DIAGNOSIS — Z720 Tobacco use: Secondary | ICD-10-CM

## 2023-09-12 DIAGNOSIS — U071 COVID-19: Secondary | ICD-10-CM | POA: Diagnosis not present

## 2023-09-12 DIAGNOSIS — J449 Chronic obstructive pulmonary disease, unspecified: Secondary | ICD-10-CM

## 2023-09-12 DIAGNOSIS — R918 Other nonspecific abnormal finding of lung field: Secondary | ICD-10-CM | POA: Diagnosis not present

## 2023-09-12 DIAGNOSIS — J9600 Acute respiratory failure, unspecified whether with hypoxia or hypercapnia: Secondary | ICD-10-CM

## 2023-09-12 DIAGNOSIS — R051 Acute cough: Secondary | ICD-10-CM

## 2023-09-12 DIAGNOSIS — R0981 Nasal congestion: Secondary | ICD-10-CM

## 2023-09-12 HISTORY — DX: Acute respiratory failure, unspecified whether with hypoxia or hypercapnia: J96.00

## 2023-09-12 HISTORY — DX: Chronic obstructive pulmonary disease with (acute) exacerbation: J44.1

## 2023-09-12 LAB — CBC WITH DIFFERENTIAL/PLATELET
Basophils Absolute: 0.1 10*3/uL (ref 0.0–0.1)
Basophils Relative: 0.9 % (ref 0.0–3.0)
Eosinophils Absolute: 0.1 10*3/uL (ref 0.0–0.7)
Eosinophils Relative: 0.9 % (ref 0.0–5.0)
HCT: 46.2 % — ABNORMAL HIGH (ref 36.0–46.0)
Hemoglobin: 15 g/dL (ref 12.0–15.0)
Lymphocytes Relative: 11 % — ABNORMAL LOW (ref 12.0–46.0)
Lymphs Abs: 1 10*3/uL (ref 0.7–4.0)
MCHC: 32.5 g/dL (ref 30.0–36.0)
MCV: 89.5 fl (ref 78.0–100.0)
Monocytes Absolute: 0.6 10*3/uL (ref 0.1–1.0)
Monocytes Relative: 6.4 % (ref 3.0–12.0)
Neutro Abs: 7.4 10*3/uL (ref 1.4–7.7)
Neutrophils Relative %: 80.8 % — ABNORMAL HIGH (ref 43.0–77.0)
Platelets: 185 10*3/uL (ref 150.0–400.0)
RBC: 5.17 Mil/uL — ABNORMAL HIGH (ref 3.87–5.11)
RDW: 16.8 % — ABNORMAL HIGH (ref 11.5–15.5)
WBC: 9.1 10*3/uL (ref 4.0–10.5)

## 2023-09-12 LAB — BASIC METABOLIC PANEL
BUN: 17 mg/dL (ref 6–23)
CO2: 28 meq/L (ref 19–32)
Calcium: 9.5 mg/dL (ref 8.4–10.5)
Chloride: 97 meq/L (ref 96–112)
Creatinine, Ser: 0.89 mg/dL (ref 0.40–1.20)
GFR: 68.13 mL/min (ref >60.00)
Glucose, Bld: 84 mg/dL (ref 70–99)
Potassium: 3.3 meq/L — ABNORMAL LOW (ref 3.5–5.1)
Sodium: 137 meq/L (ref 135–145)

## 2023-09-12 LAB — POC COVID19 BINAXNOW: SARS Coronavirus 2 Ag: POSITIVE — AB

## 2023-09-12 LAB — POCT INFLUENZA A/B
Influenza A, POC: NEGATIVE
Influenza B, POC: NEGATIVE

## 2023-09-12 MED ORDER — MOLNUPIRAVIR 200 MG PO CAPS: 4.0000 | ORAL_CAPSULE | Freq: Two times a day (BID) | ORAL | 0 refills | Status: AC

## 2023-09-12 MED ORDER — PREDNISONE 10 MG PO TABS: ORAL_TABLET | ORAL | 0 refills | Status: DC

## 2023-09-12 MED ORDER — TRELEGY ELLIPTA 200-62.5-25 MCG/ACT IN AEPB: 1.0000 | INHALATION_SPRAY | Freq: Every day | RESPIRATORY_TRACT | 11 refills | Status: DC

## 2023-09-12 MED ORDER — METHYLPREDNISOLONE ACETATE 80 MG/ML IJ SUSP
120.0000 mg | Freq: Once | INTRAMUSCULAR | Status: AC
  Administered 2023-09-12: 120 mg via INTRAMUSCULAR

## 2023-09-12 MED ORDER — AMOXICILLIN-POT CLAVULANATE 875-125 MG PO TABS: 1.0000 | ORAL_TABLET | Freq: Two times a day (BID) | ORAL | 0 refills | Status: DC

## 2023-09-12 NOTE — Assessment & Plan Note (Signed)
 New onset acute hypoxic respiratory failure in the setting of COPD exacerbation and COVID-19.  As above patient is recommended for hospital admission but unfortunately declines.  Will begin oxygen at 2 L.  O2 saturations on 2 L in the office are 97%.  Patient has been discharged from the office on a TOC device order has been sent stat to DME company for home oxygen start.  Patient is advised if oxygen levels are less than 90% on oxygen she needs to seek emergency room care.  Plan  Patient Instructions  I recommend that you be admitted as you are at high risk for worsening COPD exacerbation and possible pneumonia and worsening respiratory failure. Begin Augmentin 875 mg twice daily for 10 days, take with food Mucinex DM twice daily as needed for cough and congestion Begin Duoneb nebulizer twice daily for the next several days for cough and wheezing.  May use every 6 hours as needed Albuterol inhaler as needed. Depo-Medrol 120 IM injection. Begin prednisone taper over the next week.  Begin tomorrow on September 13, 2023 Begin oxygen 2 L.  To keep oxygen levels greater than 90%. Chest x-ray and labs today Begin molnupiravir twice daily for 5 days this is an antiviral therapy for COVID-19-please call our office back if this is not covered by your insurance or not available Follow-up in 1 week or sooner as needed As above if your symptoms are not improving you do need to seek emergency room care and evaluation and most likely be admitted to the hospital. Please contact office for sooner follow up if symptoms do not improve or worsen or seek emergency care

## 2023-09-12 NOTE — Assessment & Plan Note (Signed)
 Smoking cessation discussed

## 2023-09-12 NOTE — Assessment & Plan Note (Signed)
 Acute COPD exacerbation in the setting of COVID-19 infection (day 3) patient has associated acute respiratory failure-requiring new onset oxygen dependence.  Patient is at high risk for decompensation, suspect may have a component of pneumonia-recommendations for hospitalization with the emergency room transport via EMS.  Unfortunately patient declines after long discussion regarding potential for further decompensation-along with worsening respiratory failure.  Patient says is the sole caregiver for her husband at home who is in a wheelchair.  She says she absolutely cannot be admitted. We will try to treat aggressively in the outpatient setting with empiric antibiotics, steroids and nebulized bronchodilator and initiation of oxygen.  Also will start antiviral therapy with molnupiravir as she is high risk for decompensation.  Patient education was given. Patient is advised that if symptoms are not improving or worsen she is to go to the emergency room immediately.  Will have a close follow-up in the outpatient setting in 1 week. Depo-Medrol 120 IM injection given in the office.  Chest x-ray and labs are pending Begin Augmentin twice daily for 10 days.  Prednisone taper to begin tomorrow. Molnupiravir x 5 days.  Plan  Patient Instructions  I recommend that you be admitted as you are at high risk for worsening COPD exacerbation and possible pneumonia and worsening respiratory failure. Begin Augmentin 875 mg twice daily for 10 days, take with food Mucinex DM twice daily as needed for cough and congestion Begin Duoneb nebulizer twice daily for the next several days for cough and wheezing.  May use every 6 hours as needed Albuterol inhaler as needed. Depo-Medrol 120 IM injection. Begin prednisone taper over the next week.  Begin tomorrow on September 13, 2023 Begin oxygen 2 L.  To keep oxygen levels greater than 90%. Chest x-ray and labs today Begin molnupiravir twice daily for 5 days this is an  antiviral therapy for COVID-19-please call our office back if this is not covered by your insurance or not available Follow-up in 1 week or sooner as needed As above if your symptoms are not improving you do need to seek emergency room care and evaluation and most likely be admitted to the hospital. Please contact office for sooner follow up if symptoms do not improve or worsen or seek emergency care

## 2023-09-12 NOTE — Progress Notes (Signed)
 @Patient  ID: Lisa Navarro, female    DOB: [birth year], 68 y.o.   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  Mild emphysema, incidental finding of atherosclerosis and calcifications on the aortic valve   2D echo August 03, 2021 showed EF at 60-65%, grade 1 diastolic dysfunction, right ventricular systolic function is normal right ventricular size is normal aortic valve is normal no regurgitation and no or aortic valve stenosis.   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  Patient says she has been sick over the last 3 days with fever, cough, congestion, wheezing.  Has been very short of breath.  On arrival to the office today O2 saturations were 79% on room air.  Required 2 L of oxygen to get O2 saturations greater than 90%.  Patient is not on oxygen at home.  She does continue smoke.  She has moderate COPD with emphysema.  She is on Trelegy inhaler,.  Flu swab today in office is negative.  COVID nasal swab is positive.  Patient says she has had some sick coworkers with possible exposure.  Appetite has been decreased but she has no nausea vomiting or diarrhea.  Has been drinking plenty of fluids.  She does have albuterol inhaler and DuoNeb nebulizer at home.  She denies any hemoptysis, chest pain, orthopnea, edema. She is the sole caregiver of her husband at home who is in a wheelchair. Patient works full-time.  She is fully  independent.  Allergies  Allergen Reactions   Sulfa Antibiotics Rash    She breaks out.      Immunization History  Administered Date(s) Administered   Influenza, High Dose Seasonal PF 06/07/2021   Influenza,inj,Quad PF,6+ Mos 04/06/2022   Influenza-Unspecified 03/16/2018   Moderna Sars-Covid-2 Vaccination 09/25/2019, 10/21/2019, 08/31/2020    No past medical history on file.  Tobacco History: Social History   Tobacco Use  Smoking Status Every Day   Current packs/day: 2.00   Average packs/day: 2.0 packs/day for 52.2 years (104.3 ttl pk-yrs)   Types: Cigarettes   Start date: 1973  Smokeless Tobacco Never  Tobacco Comments   Currently smoking 1 PPD 09/12/23>> Lung Cancer Screening [facility]   Ready to quit: No Counseling given: Yes Tobacco comments: Currently smoking 1 PPD 09/12/23>> Lung Cancer Screening [facility]   Outpatient Medications Prior to Visit  Medication Sig Dispense Refill   fexofenadine (ALLEGRA) 180 MG tablet Take 180 mg by mouth daily.     fluticasone (FLONASE) 50 MCG/ACT nasal spray Place 2 sprays into both nostrils daily.     furosemide (LASIX) 40 MG tablet Take 40 mg by mouth daily.     ipratropium-albuterol (DUONEB) 0.5-2.5 (3) MG/3ML SOLN Take 3 mLs by nebulization every 6 (six) hours as needed.     levothyroxine (SYNTHROID) 137 MCG tablet Take 137 mcg by mouth every morning.     losartan-hydrochlorothiazide (HYZAAR) 50-12.5 MG tablet Take 1 tablet by mouth daily.  montelukast (SINGULAIR) 10 MG tablet Take 10 mg by mouth at bedtime.     Multiple Vitamin (MULTIVITAMIN) tablet Take 1 tablet by mouth daily.     potassium chloride (KLOR-CON M) 10 MEQ tablet Take 10 mEq by mouth daily.     rosuvastatin (CRESTOR) 20 MG tablet Take 20 mg by mouth at bedtime.     Vitamin D, Ergocalciferol, (DRISDOL) 1.25 MG (50000 UNIT) CAPS capsule Take 50,000 Units by mouth once a week.     Fluticasone-Umeclidin-Vilant (TRELEGY ELLIPTA) 100-62.5-25 MCG/ACT AEPB INHALE 1 PUFF INTO  THE LUNGS DAILY 60 each 3   albuterol (VENTOLIN HFA) 108 (90 Base) MCG/ACT inhaler Inhale 2 puffs into the lungs every 6 (six) hours as needed for wheezing or shortness of breath. (Patient not taking: Reported on 09/12/2023)     levothyroxine (SYNTHROID) 150 MCG tablet Take 150 mcg by mouth every morning.     Probiotic Product (PROBIOTIC-10 PO) Take 2 tablets by mouth daily.     Fluticasone-Umeclidin-Vilant (TRELEGY ELLIPTA) 100-62.5-25 MCG/ACT AEPB INHALE 1 PUFF INTO THE LUNGS DAILY 60 each 6   Fluticasone-Umeclidin-Vilant (TRELEGY ELLIPTA) 200-62.5-25 MCG/ACT AEPB Inhale 1 puff into the lungs daily. 14 each 0   TRELEGY ELLIPTA 100-62.5-25 MCG/ACT AEPB Inhale 1 puff into the lungs daily.     No facility-administered medications prior to visit.     Review of Systems:   Constitutional:   No  weight loss, night sweats,  Fevers, chills, +fatigue, or  lassitude.  HEENT:   No headaches,  Difficulty swallowing,  Tooth/dental problems, or  Sore throat,                No sneezing, itching, ear ache,+ nasal congestion, post nasal drip,   CV:  No chest pain,  Orthopnea, PND, swelling in lower extremities, anasarca, dizziness, palpitations, syncope.   GI  No heartburn, indigestion, abdominal pain, nausea, vomiting, diarrhea, change in bowel habits, loss of appetite, bloody stools.   Resp:  No chest wall deformity  Skin: no rash or lesions.  GU: no dysuria, change in color of urine, no urgency or frequency.  No flank pain, no hematuria   MS:  No joint pain or swelling.  No decreased range of motion.  No back pain.    Physical Exam  BP 136/64 (BP Location: Left Arm, Patient Position: Sitting, Cuff Size: Large)   Pulse (!) 102   Ht 5\' 2"  (1.575 m)   Wt 233 lb 12.8 oz (106.1 kg)   SpO2 (!) 79%   BMI 42.76 kg/m   GEN: A/Ox3; pleasant , NAD, well nourished    HEENT:  [place]/AT,   NOSE-clear, THROAT-clear, no lesions, no postnasal drip or exudate noted.   NECK:  Supple w/ fair ROM; no JVD;  normal carotid impulses w/o bruits; no thyromegaly or nodules palpated; no lymphadenopathy.    RESP coarse rhonchi with bilateral wheezing.  no accessory muscle use, no dullness to percussion  CARD:  RRR, no m/r/g, no peripheral edema, pulses intact, no cyanosis or clubbing.  GI:   Soft & nt; nml bowel sounds; no organomegaly or masses detected.   Musco: Warm bil, no deformities or joint swelling noted.   Neuro: alert, no focal deficits noted.    Skin: Warm, no lesions or rashes    Lab Results:  CBC No results found for: "WBC", "RBC", "HGB", "HCT", "PLT", "MCV", "MCH", "MCHC", "RDW", "LYMPHSABS", "MONOABS", "EOSABS", "BASOSABS"  BMET No results found for: "NA", "K", "CL", "CO2", "GLUCOSE", "BUN", "CREATININE", "CALCIUM", "GFRNONAA", "GFRAA"  BNP No results found for: "BNP"  ProBNP No results found for: "PROBNP"  Imaging: No results found.  Administration History     None          Latest Ref Rng & Units 09/13/2021    8:28 AM  PFT Results  FVC-Pre L 1.91   FVC-Predicted Pre % 58   FVC-Post L 1.88   FVC-Predicted Post % 57   Pre FEV1/FVC % % 73   Post FEV1/FCV % % 74   FEV1-Pre L 1.40   FEV1-Predicted Pre % 55   FEV1-Post L 1.39   DLCO uncorrected ml/min/mmHg 10.75   DLCO UNC% % 52   DLCO corrected ml/min/mmHg 10.75   DLCO COR %Predicted % 52   DLVA Predicted % 79   TLC L 4.43   TLC % Predicted % 86   RV % Predicted % 102     No results found for: "NITRICOXIDE"      Assessment & Plan:   COPD with acute exacerbation (HCC) Acute COPD exacerbation in the setting of COVID-19 infection (day 3) patient has associated acute respiratory failure-requiring new onset oxygen dependence.  Patient is at high risk for decompensation, suspect may have a component of pneumonia-recommendations for hospitalization with the emergency room transport via EMS.  Unfortunately patient declines after long discussion regarding potential for further decompensation-along with  worsening respiratory failure.  Patient says is the sole caregiver for her husband at home who is in a wheelchair.  She says she absolutely cannot be admitted. We will try to treat aggressively in the outpatient setting with empiric antibiotics, steroids and nebulized bronchodilator and initiation of oxygen.  Also will start antiviral therapy with molnupiravir as she is high risk for decompensation.  Patient education was given. Patient is advised that if symptoms are not improving or worsen she is to go to the emergency room immediately.  Will have a close follow-up in the outpatient setting in 1 week. Depo-Medrol 120 IM injection given in the office.  Chest x-ray and labs are pending Begin Augmentin twice daily for 10 days.  Prednisone taper to begin tomorrow. Molnupiravir x 5 days.  Plan  Patient Instructions  I recommend that you be admitted as you are at high risk for worsening COPD exacerbation and possible pneumonia and worsening respiratory failure. Begin Augmentin 875 mg twice daily for 10 days, take with food Mucinex DM twice daily as needed for cough and congestion Begin Duoneb nebulizer twice daily for the next several days for cough and wheezing.  May use every 6 hours as needed Albuterol inhaler as needed. Depo-Medrol 120 IM injection. Begin prednisone taper over the next week.  Begin tomorrow on September 13, 2023 Begin oxygen 2 L.  To keep oxygen levels greater than 90%. Chest x-ray and labs today Begin molnupiravir twice daily for 5 days this is an antiviral therapy for COVID-19-please call our office back if this is not covered by your insurance or not available Follow-up in 1 week or sooner as needed As above if your symptoms are not improving you do need to seek emergency room care and evaluation and most likely be admitted to the hospital. Please contact office for sooner follow up if symptoms do not improve or worsen or seek emergency care          Respiratory  failure, acute (HCC) New onset acute hypoxic respiratory failure in the setting of COPD exacerbation and COVID-19.  As above patient is recommended for hospital admission but unfortunately declines.  Will begin oxygen at 2 L.  O2 saturations on 2 L in the office are 97%.  Patient has been discharged from the office on a TOC device order has been sent stat to DME company for home oxygen start.  Patient is advised if oxygen levels are less than 90% on oxygen she needs to seek emergency room care.  Plan  Patient Instructions  I recommend that you be admitted as you are at high risk for worsening COPD exacerbation and possible pneumonia and worsening respiratory failure. Begin Augmentin 875 mg twice daily for 10 days, take with food Mucinex DM twice daily as needed for cough and congestion Begin Duoneb nebulizer twice daily for the next several days for cough and wheezing.  May use every 6 hours as needed Albuterol inhaler as needed. Depo-Medrol 120 IM injection. Begin prednisone taper over the next week.  Begin tomorrow on September 13, 2023 Begin oxygen 2 L.  To keep oxygen levels greater than 90%. Chest x-ray and labs today Begin molnupiravir twice daily for 5 days this is an antiviral therapy for COVID-19-please call our office back if this is not covered by your insurance or not available Follow-up in 1 week or sooner as needed As above if your symptoms are not improving you do need to seek emergency room care and evaluation and most likely be admitted to the hospital. Please contact office for sooner follow up if symptoms do not improve or worsen or seek emergency care        Tobacco abuse Smoking cessation discussed     Rubye Oaks, NP 09/12/2023

## 2023-09-12 NOTE — Patient Instructions (Addendum)
 I recommend that you be admitted as you are at high risk for worsening COPD exacerbation and possible pneumonia and worsening respiratory failure. Begin Augmentin 875 mg twice daily for 10 days, take with food Mucinex DM twice daily as needed for cough and congestion Begin Duoneb nebulizer twice daily for the next several days for cough and wheezing.  May use every 6 hours as needed Albuterol inhaler as needed. Depo-Medrol 120 IM injection. Begin prednisone taper over the next week.  Begin tomorrow on September 13, 2023 Begin oxygen 2 L.  To keep oxygen levels greater than 90%. Chest x-ray and labs today Begin molnupiravir twice daily for 5 days this is an antiviral therapy for COVID-19-please call our office back if this is not covered by your insurance or not available Follow-up in 1 week or sooner as needed As above if your symptoms are not improving you do need to seek emergency room care and evaluation and most likely be admitted to the hospital. Please contact office for sooner follow up if symptoms do not improve or worsen or seek emergency care

## 2023-09-12 NOTE — Addendum Note (Signed)
 Addended byFloydene Flock, Lynetta Tomczak A on: 09/12/2023 01:01 PM   Modules accepted: Orders

## 2023-09-13 DIAGNOSIS — J9601 Acute respiratory failure with hypoxia: Secondary | ICD-10-CM | POA: Diagnosis not present

## 2023-09-17 ENCOUNTER — Encounter: Payer: Self-pay | Admitting: *Deleted

## 2023-09-17 ENCOUNTER — Encounter: Payer: Self-pay | Admitting: Adult Health

## 2023-09-17 ENCOUNTER — Ambulatory Visit: Payer: BC Managed Care – PPO | Admitting: Adult Health

## 2023-09-17 ENCOUNTER — Other Ambulatory Visit (HOSPITAL_COMMUNITY)

## 2023-09-17 ENCOUNTER — Telehealth: Payer: Self-pay | Admitting: *Deleted

## 2023-09-17 ENCOUNTER — Telehealth: Payer: Self-pay | Admitting: Adult Health

## 2023-09-17 VITALS — BP 134/86 | HR 75 | Temp 98.4°F | Ht 62.0 in | Wt 235.0 lb

## 2023-09-17 DIAGNOSIS — U071 COVID-19: Secondary | ICD-10-CM

## 2023-09-17 DIAGNOSIS — J441 Chronic obstructive pulmonary disease with (acute) exacerbation: Secondary | ICD-10-CM | POA: Diagnosis not present

## 2023-09-17 DIAGNOSIS — R0602 Shortness of breath: Secondary | ICD-10-CM

## 2023-09-17 DIAGNOSIS — Z720 Tobacco use: Secondary | ICD-10-CM

## 2023-09-17 DIAGNOSIS — F17210 Nicotine dependence, cigarettes, uncomplicated: Secondary | ICD-10-CM

## 2023-09-17 DIAGNOSIS — J9601 Acute respiratory failure with hypoxia: Secondary | ICD-10-CM

## 2023-09-17 DIAGNOSIS — J1282 Pneumonia due to coronavirus disease 2019: Secondary | ICD-10-CM

## 2023-09-17 DIAGNOSIS — R6889 Other general symptoms and signs: Secondary | ICD-10-CM

## 2023-09-17 HISTORY — DX: Pneumonia due to coronavirus disease 2019: J12.82

## 2023-09-17 HISTORY — DX: COVID-19: U07.1

## 2023-09-17 NOTE — Assessment & Plan Note (Signed)
 Acute COPD exacerbation in the setting of COVID-19 infection and superimposed pneumonia-clinically patient is improving.  Will have her complete full course of antibiotics and steroids along with antiviral therapy with molnupiravir.  She continues to have desaturations requiring oxygen.  Unable to maintain O2 saturations on POC device.  Will continue on continuous flow only 2 L at rest and 3 L with activity.  Will be unable to return back to work-continue out of work until seen back in the office in 2 weeks we will reevaluate oxygen demands at that time.  Plan  Patient Instructions  Set up for stat CT Chest with PE protocol  Finish Augmentin and Prednisone and Molnupiravir as directed.  Mucinex DM twice daily as needed for cough and congestion Continue on Trelegy 1 puff daily  Use Duoneb nebulizer every 6hr as needed.  Albuterol inhaler as needed. Continue on Oxygen 2 L at rest and 3l/m with activity .  To keep oxygen levels greater than 90%. Out of work until seen back in office.  Follow up with in 2-3 weeks with Dr. Marchelle Gearing or Rishika Mccollom NP and As needed   Please contact office for sooner follow up if symptoms do not improve or worsen or seek emergency care

## 2023-09-17 NOTE — Assessment & Plan Note (Signed)
 Chest x-ray with bilateral interstitial opacities consistent with pneumonia in the setting of COVID-19 infection.  Patient is to complete Augmentin x 10 days.  Clinically is improving.  CT chest is pending.  Will need serial follow-up of CT/chest x-ray for clearance.

## 2023-09-17 NOTE — Progress Notes (Signed)
 @Patient  ID: Lisa Navarro, female    DOB: [birth year], 68 y.o.   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  Mild emphysema, incidental finding of atherosclerosis and calcifications on the aortic valve   2D echo August 03, 2021 showed EF at 60-65%, grade 1 diastolic dysfunction, right ventricular systolic function is normal right ventricular size is normal aortic valve is normal no regurgitation and no or aortic valve stenosis.   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  Patient was seen last week for an acute COPD exacerbation in the setting of COVID-19 infection..  Last office visit she presented with 3-day history of fever cough and congestion with wheezing and decreased O2 saturations.  She was recommended for hospitalization however declined.  She was treated in the outpatient setting with Augmentin x 10 days and a prednisone taper for suspected superimposed bronchitis/early pneumonia.  She was started on molnupiravir.  She was given a Medrol injection.  Started on oxygen at 2 L.  Chest x-ray showed superimposed interstitial opacities in the lower lungs.  Lab work showed a normal WBC count.  Since last visit patient is feeling She continues smoke.  We discussed smoking cessation. Has few days left of Augmentin and Prednisone. Last dose of Molnupiravir is today. Remains on Trelegy daily.  Still smoking. Discussed smoking cessation .  She says she is  feeling better.  Continues to get short of breath with walking.  O2 saturations required 2 L of oxygen at rest and 3 L of oxygen walking to maintain O2 saturations greater than 88 to 90%.  She was unable to keep her O2 saturations greater than 88% on POC device.  Patient remains out of work.  She works full-time 12-hour shifts.  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  She says she is eating okay.  Drinking well.  No nausea vomiting or diarrhea. She denies any chest pain orthopnea calf pain, edema or hemoptysis.  No history of VTE.  Allergies  Allergen Reactions   Sulfa Antibiotics Rash    She breaks out.      Immunization History  Administered Date(s) Administered   Influenza, High Dose Seasonal PF 06/07/2021   Influenza,inj,Quad PF,6+ Mos 04/06/2022   Influenza-Unspecified 03/16/2018   Moderna Sars-Covid-2 Vaccination 09/25/2019, 10/21/2019, 08/31/2020    No past medical history on file.  Tobacco History: Social History   Tobacco Use  Smoking Status Every Day   Current packs/day: 2.00   Average packs/day: 2.0 packs/day for 52.2 years (104.3 ttl pk-yrs)   Types: Cigarettes   Start date: 1973  Smokeless Tobacco Never  Tobacco Comments   Currently smoking half a pack daily  09/17/2023.   Ready to quit: No Counseling given: Yes Tobacco comments: Currently smoking half a pack daily  09/17/2023.   Outpatient Medications Prior to Visit  Medication Sig Dispense Refill   albuterol (VENTOLIN HFA) 108 (90 Base) MCG/ACT inhaler Inhale 1  puff into the lungs every 6 (six) hours as needed for wheezing or shortness of breath. Taking DAILY     amoxicillin-clavulanate (AUGMENTIN) 875-125 MG tablet Take 1 tablet by mouth 2 (two) times daily. 20 tablet 0   fexofenadine (ALLEGRA) 180 MG tablet Take 180 mg by mouth daily.     fluticasone (FLONASE) 50 MCG/ACT nasal spray Place 2 sprays into both nostrils daily.     Fluticasone-Umeclidin-Vilant (TRELEGY  ELLIPTA) 200-62.5-25 MCG/ACT AEPB Inhale 1 puff into the lungs daily. 1 each 11   furosemide (LASIX) 40 MG tablet Take 40 mg by mouth daily.     ipratropium-albuterol (DUONEB) 0.5-2.5 (3) MG/3ML SOLN Take 3 mLs by nebulization every 6 (six) hours as needed.     levothyroxine (SYNTHROID) 137 MCG tablet Take 137 mcg by mouth every morning.     losartan-hydrochlorothiazide (HYZAAR) 50-12.5 MG tablet Take 1 tablet by mouth daily.     molnupiravir EUA (LAGEVRIO) 200 MG CAPS capsule Take 4 capsules (800 mg total) by mouth 2 (two) times daily for 5 days. 40 capsule 0   montelukast (SINGULAIR) 10 MG tablet Take 10 mg by mouth at bedtime.     Multiple Vitamin (MULTIVITAMIN) tablet Take 1 tablet by mouth daily.     potassium chloride (KLOR-CON M) 10 MEQ tablet Take 10 mEq by mouth daily.     predniSONE (DELTASONE) 10 MG tablet 4 tabs for 2 days, then 3 tabs for 2 days, 2 tabs for 2 days, then 1 tab for 2 days, then stop 20 tablet 0   rosuvastatin (CRESTOR) 20 MG tablet Take 20 mg by mouth at bedtime.     Vitamin D, Ergocalciferol, (DRISDOL) 1.25 MG (50000 UNIT) CAPS capsule Take 50,000 Units by mouth once a week.     levothyroxine (SYNTHROID) 150 MCG tablet Take 150 mcg by mouth every morning.     Probiotic Product (PROBIOTIC-10 PO) Take 2 tablets by mouth daily.     No facility-administered medications prior to visit.     Review of Systems:   Constitutional:   No  weight loss, night sweats,  Fevers, chills, +fatigue, or  lassitude.  HEENT:   No headaches,  Difficulty swallowing,  Tooth/dental problems, or  Sore throat,                No sneezing, itching, ear ache, nasal congestion, post nasal drip,   CV:  No chest pain,  Orthopnea, PND, swelling in lower extremities, anasarca, dizziness, palpitations, syncope.   GI  No heartburn, indigestion, abdominal pain, nausea, vomiting, diarrhea, change in bowel habits, loss of appetite, bloody stools.   RESP: .  No chest wall deformity  Skin: no rash  or lesions.  GU: no dysuria, change in color of urine, no urgency or frequency.  No flank pain, no hematuria   MS:  No joint pain or swelling.  No decreased range of motion.  No back pain.    Physical Exam  BP 134/86 (BP Location: Left Arm, Patient Position: Sitting)   Pulse 75   Temp 98.4 F (36.9 C) (Oral)   Ht 5\' 2"  (1.575 m)   Wt 235 lb (106.6 kg)   SpO2 91% Comment: 2L Tank  BMI 42.98 kg/m   GEN: A/Ox3; pleasant , NAD, well nourished    HEENT:  [place]/AT,  NOSE-clear, THROAT-clear, no lesions, no postnasal drip or exudate noted.   NECK:  Supple w/ fair ROM; no JVD; normal carotid impulses w/o bruits; no thyromegaly or nodules palpated; no  lymphadenopathy.    RESP improved aeration bilaterally.   w/o, wheezes/ rales/ or rhonchi. no accessory muscle use, no dullness to percussion  CARD:  RRR, no m/r/g, no peripheral edema, pulses intact, no cyanosis or clubbing.  GI:   Soft & nt; nml bowel sounds; no organomegaly or masses detected.   Musco: Warm bil, no deformities or joint swelling noted.   Neuro: alert, no focal deficits noted.    Skin: Warm, no lesions or rashes    Lab Results: School expenses   BNP No results found for: "BNP"  ProBNP No results found for: "PROBNP"  Imaging: DG Chest 2 View Result Date: 09/12/2023 CLINICAL DATA:  COPD EXAM: CHEST - 2 VIEW COMPARISON:  01/03/2022 FINDINGS: The heart size and mediastinal contours are stable. Aortic atherosclerosis. Chronically coarsened interstitial markings bilaterally with superimposed interstitial opacities in the mid to lower lung fields bilaterally. No pleural effusion or pneumothorax. IMPRESSION: Chronically coarsened interstitial markings bilaterally with superimposed interstitial opacities in the mid to lower lung fields bilaterally. Findings may reflect atypical/viral infection or pulmonary edema. Electronically Signed   By: Duanne Guess D.O.   On: 09/12/2023 14:47    methylPREDNISolone acetate  (DEPO-MEDROL) injection 120 mg     Date Action Dose Route User   09/12/2023 1259 Given 120 mg Intramuscular (Left Upper Outer Quadrant) McHone, Noel A, CMA          Latest Ref Rng & Units 09/13/2021    8:28 AM  PFT Results  FVC-Pre L 1.91   FVC-Predicted Pre % 58   FVC-Post L 1.88   FVC-Predicted Post % 57   Pre FEV1/FVC % % 73   Post FEV1/FCV % % 74   FEV1-Pre L 1.40   FEV1-Predicted Pre % 55   FEV1-Post L 1.39   DLCO uncorrected ml/min/mmHg 10.75   DLCO UNC% % 52   DLCO corrected ml/min/mmHg 10.75   DLCO COR %Predicted % 52   DLVA Predicted % 79   TLC L 4.43   TLC % Predicted % 86   RV % Predicted % 102     No results found for: "NITRICOXIDE"      Assessment & Plan:   COPD with acute exacerbation (HCC) Acute COPD exacerbation in the setting of COVID-19 infection and superimposed pneumonia-clinically patient is improving.  Will have her complete full course of antibiotics and steroids along with antiviral therapy with molnupiravir.  She continues to have desaturations requiring oxygen.  Unable to maintain O2 saturations on POC device.  Will continue on continuous flow only 2 L at rest and 3 L with activity.  Will be unable to return back to work-continue out of work until seen back in the office in 2 weeks we will reevaluate oxygen demands at that time.  Plan  Patient Instructions  Set up for stat CT Chest with PE protocol  Finish Augmentin and Prednisone and Molnupiravir as directed.  Mucinex DM twice daily as needed for cough and congestion Continue on Trelegy 1 puff daily  Use Duoneb nebulizer every 6hr as needed.  Albuterol inhaler as needed. Continue on Oxygen 2 L at rest and 3l/m with activity .  To keep oxygen levels greater than 90%. Out of work until seen back in office.  Follow up with in 2-3 weeks with Dr. Marchelle Gearing or Akari Crysler NP and As needed   Please contact office for sooner follow up if symptoms do not improve or worsen or seek emergency care        Respiratory  failure, acute (HCC) New onset hypoxic respiratory failure in the setting of COPD exacerbation with COVID-19.  Patient continues to have ongoing oxygen demands.  Will check CT chest PE protocol to rule out PE. Continue on oxygen 2 L at rest and 3 L with activity continuous flow only.  Unable to maintain O2 saturations on POC device.  COVID-19 virus infection COVID-19 infection-clinically patient appears to be stable.  Will have her finish antiviral therapy with molnupiravir.  Pneumonia due to COVID-19 virus Chest x-ray with bilateral interstitial opacities consistent with pneumonia in the setting of COVID-19 infection.  Patient is to complete Augmentin x 10 days.  Clinically is improving.  CT chest is pending.  Will need serial follow-up of CT/chest x-ray for clearance.  Tobacco abuse Smoking cessation discussed in detail.  Patient advised of no smoking around oxygen.     Rubye Oaks, NP 09/17/2023

## 2023-09-17 NOTE — Assessment & Plan Note (Signed)
 COVID-19 infection-clinically patient appears to be stable.  Will have her finish antiviral therapy with molnupiravir.

## 2023-09-17 NOTE — Telephone Encounter (Signed)
 Patient preferred to have CTA at [facility], they did not have any availability for 1 week.  Patient was called by the [facility] and advised that they could do the scan at [facility] in [place] today.  She had already arrived home and did not want to come back.  Tammy Parrett NP stated it would be ok to wait until tomorrow to have it done at [facility].  I called and spoke with patient and advised her that the [facility] got the CTA scheduled at [facility] tomorrow at 11:30 am and that she needed to arrive by 11:15 am for check in.  She verbalized understanding.  Nothing further needed.

## 2023-09-17 NOTE — Assessment & Plan Note (Signed)
 New onset hypoxic respiratory failure in the setting of COPD exacerbation with COVID-19.  Patient continues to have ongoing oxygen demands.  Will check CT chest PE protocol to rule out PE. Continue on oxygen 2 L at rest and 3 L with activity continuous flow only.  Unable to maintain O2 saturations on POC device.

## 2023-09-17 NOTE — Telephone Encounter (Signed)
 PT was told to make a 2 week FU w/Ms. Parrett. She has a letter stating she needs to stay out of work until she is seen again. Can I double book her for a FU before this 2 weeks? Please ask Ms. Parrett and fwd to [employer] pool if ok with her.

## 2023-09-17 NOTE — Patient Instructions (Addendum)
 Set up for stat CT Chest with PE protocol  Finish Augmentin and Prednisone and Molnupiravir as directed.  Mucinex DM twice daily as needed for cough and congestion Continue on Trelegy 1 puff daily  Use Duoneb nebulizer every 6hr as needed.  Albuterol inhaler as needed. Continue on Oxygen 2 L at rest and 3l/m with activity .  To keep oxygen levels greater than 90%. Out of work until seen back in office.  Follow up with in 2-3 weeks with Dr. Marchelle Gearing or Tulsi Crossett NP and As needed   Please contact office for sooner follow up if symptoms do not improve or worsen or seek emergency care

## 2023-09-17 NOTE — Assessment & Plan Note (Signed)
 Smoking cessation discussed in detail.  Patient advised of no smoking around oxygen.

## 2023-09-18 ENCOUNTER — Ambulatory Visit (HOSPITAL_COMMUNITY)
Admission: RE | Admit: 2023-09-18 | Discharge: 2023-09-18 | Disposition: A | Discharge status: Home / Self Care | Source: Ambulatory Visit | Attending: Adult Health | Admitting: Adult Health

## 2023-09-18 DIAGNOSIS — R0602 Shortness of breath: Secondary | ICD-10-CM | POA: Diagnosis not present

## 2023-09-18 DIAGNOSIS — J432 Centrilobular emphysema: Secondary | ICD-10-CM | POA: Diagnosis not present

## 2023-09-18 DIAGNOSIS — R0902 Hypoxemia: Secondary | ICD-10-CM | POA: Diagnosis not present

## 2023-09-18 DIAGNOSIS — R6889 Other general symptoms and signs: Secondary | ICD-10-CM | POA: Insufficient documentation

## 2023-09-18 DIAGNOSIS — R918 Other nonspecific abnormal finding of lung field: Secondary | ICD-10-CM | POA: Diagnosis not present

## 2023-09-18 MED ORDER — IOHEXOL 350 MG/ML SOLN
75.0000 mL | Freq: Once | INTRAVENOUS | Status: AC | PRN
  Administered 2023-09-18: 75 mL via INTRAVENOUS

## 2023-09-18 MED ORDER — SODIUM CHLORIDE (PF) 0.9 % IJ SOLN
INTRAMUSCULAR | Status: AC
  Filled 2023-09-18: qty 50

## 2023-09-19 NOTE — Telephone Encounter (Signed)
 Patient is already scheduled to see Dr. Marchelle Gearing on 10/01/23 at 11 am.  Nothing further needed.

## 2023-09-19 NOTE — Telephone Encounter (Signed)
 May add her on 10/02/23 am schedule . Thank you .

## 2023-09-24 NOTE — Progress Notes (Signed)
 Called and spoke with patient, advised of results/recommendations per Rubye Oaks NP.  She verbalized understanding.  She stated she was feeling better than she was at her visit.  She will follow up with Dr. Marchelle Gearing on 3/18 with a cxr.  She will call if she needs anything in the meantime.  Nothing further needed.

## 2023-09-26 ENCOUNTER — Telehealth: Payer: Self-pay | Admitting: *Deleted

## 2023-09-26 NOTE — Telephone Encounter (Signed)
 Received forms from [employer] for the patients disability. Forms received 09/23/2023 and need to be out by 10/09/2023. Will place in Dr Marchelle Gearing box for completion she has an appointment 10/01/2023.

## 2023-10-01 ENCOUNTER — Ambulatory Visit: Admitting: Internal Medicine

## 2023-10-01 ENCOUNTER — Encounter: Payer: Self-pay | Admitting: Internal Medicine

## 2023-10-01 ENCOUNTER — Encounter: Payer: Self-pay | Admitting: *Deleted

## 2023-10-01 VITALS — BP 117/69 | HR 76 | Temp 98.0°F | Ht 64.5 in | Wt 236.8 lb

## 2023-10-01 DIAGNOSIS — Z8616 Personal history of COVID-19: Secondary | ICD-10-CM

## 2023-10-01 DIAGNOSIS — J449 Chronic obstructive pulmonary disease, unspecified: Secondary | ICD-10-CM

## 2023-10-01 DIAGNOSIS — IMO0001 Reserved for inherently not codable concepts without codable children: Secondary | ICD-10-CM

## 2023-10-01 DIAGNOSIS — Z7689 Persons encountering health services in other specified circumstances: Secondary | ICD-10-CM

## 2023-10-01 DIAGNOSIS — F17210 Nicotine dependence, cigarettes, uncomplicated: Secondary | ICD-10-CM | POA: Diagnosis not present

## 2023-10-01 DIAGNOSIS — U071 COVID-19: Secondary | ICD-10-CM

## 2023-10-01 DIAGNOSIS — F17200 Nicotine dependence, unspecified, uncomplicated: Secondary | ICD-10-CM

## 2023-10-01 NOTE — Patient Instructions (Addendum)
 COVID-19 virus infection - late feb 2025  Resolved  Plan  - will get cxr at [place] in several months at followup - take return to note work from [facility]   Stage 2 moderate COPD by GOLD classification (HCC)  Stable  Plan  Continue trelegy and singulair Do pft in 6 months If repeated flare ups happen, can consider Othuvayre or dupixent   Smoking  Plan  - encourage quit smoking  - respect declining referral or zyban or chantix  Followup 6 months 15 min visit Dr Illene Silver

## 2023-10-01 NOTE — Progress Notes (Signed)
 OV 07/27/2021  Subjective:  Patient ID: Lisa Navarro, female , DOB: 1955-11-11 , age 68 y.o. , MRN: [id number] , ADDRESS: [address] [place] [address] PCP Jerrye Bushy, FNP Patient Care Team: Jerrye Bushy, FNP as PCP - General (Family Medicine)  This Provider for this visit: Treatment Team:  Attending Provider: Kalman Shan, MD    07/27/2021 -   Chief Complaint  Patient presents with   Consult    Pt is here for eval of ILD/COPD.  Pt states that she has complaints of SOB that states can happen at anytime.     HPI Lisa Navarro 68 y.o. -it is unclear if she has ILD but she has been referred here because of chronic shortness of breath all made worse by recent respiratory viral infection.  She works in Financial trader.  She has to walk fair amount of distances.  Each distance that is greater than 150 feet.  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  She also is a chronic cough.  She is put this down to being obese, older than 68 years of age and also smoking history.  She is never really paid attention to this.  In the summer 2022 her physician gave her a inhaler but she did not use it.  Then around Thanksgiving 2022 she developed high fever and also worsening cough.  She rested and then got better but then got worse again.  Around June 16, 2021 she tested herself for COVID at [facility] and this was negative.  Then she started getting worse.  June 26, 2021 she saw primary care physician and was given 10 days of Augmentin and 5 days of prednisone.  After that she is significantly better.  Since then she has been taking Trelegy regularly.  Apparently her pulse ox was 60% on the finger on June 26, 2021.  Therefore primary care physician took her out of work.  Started on home oxygen.  She says she is substantially improved and almost back to baseline.  She  stopped using oxygen few weeks ago.  This is based on subjective response.  Here walking desaturation test she is shows a tendency to desaturate but is not really gone below 90%.  This is adequate.  She really wants to go back to work.  Currently when we did walking desaturation test she denied any chest pain or dizziness or diplopia or palpitations.  She only had some shortness of breath.       Past Medical History :  -Obesity Smoker - She believes she might have COPD/asthma - History of pneumonia in 2015 - She did have COVID in October 2021.  She is fully vaccinated against COVID   ROS:  Review of system positive for shortness of breath and snoring and obesity  FAMILY HISTORY of LUNG DISEASE:  --Her sister has COPD.  Her brother has premature graying of the hair  PERSONAL EXPOSURE HISTORY:  -She has been smoking since 21 7520-25 cigarettes/day.  She still smokes.  Struggle to quit.  Never used marijuana.  No cocaine no intravenous drug use  HOME  EXPOSURE and HOBBY DETAILS :  [place]  Age of the current home is built in 93.  She does have a humidifier but no CPAP.  She does  have a nebulizer machine.  Is no mold in it.  There is no mold in the house.  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  Patient has underlying COPD  with emphysema.  She has a very heavy smoking history with over 120-pack-year history.  We discussed smoking cessation in detail. Has cut down to 1 PPD .  Patient was referred to the lung cancer screening program last visit.  Will not be due until Feb 2024 .  Complains over last 4 weeks had cough, congestion and wheezing . Seen at urgent care treated for COPD exacerbation and pneumonia. Started on Doxycycline and Augmentin , prednisone taper. Feeling better but still has cough.  Not using anything for cough . Has not tested for [employer] . Eating well with no n/v/d. No hemoptysis.  Does have some nasal congestion and postnasal drainage. Remains on Trelegy inhaler daily.  TEST/EVENTS :  High-resolution CT chest August 25, 2021 is negative for ILD.  Mild emphysema, incidental finding of atherosclerosis and calcifications on the aortic valve  2D echo August 03, 2021 showed EF at 60-65%, grade 1 diastolic dysfunction, right ventricular systolic function is normal right ventricular size is normal aortic valve is normal no regurgitation and no or aortic valve stenosis.  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 , MRN: [id number] , ADDRESS: [address] [place] [address] PCP Lucianne Lei, MD Patient Care Team: Lucianne Lei, MD as PCP - General (Internal Medicine)  This Provider for this visit: Treatment Team:  Attending Provider: Kalman Shan, MD    11/30/2022 -   Chief Complaint  Patient presents with   Follow-up    Doing well.  No sx noted.      HPI Lisa Navarro 68 y.o. -I originally saw her as dyspnea workup in January 2023.  After that confirmed to have Gold stage II COPD and saw nurse  practitioner.  She is on Trelegy.  She is here today because the Trelegy needs refill but otherwise she is doing well.  Shortness of breath is stable.  She says she had a CT scan at [facility] .  thought she did not and ordered it but later CMA told me that she has had a CT and therefore we canceled it.  This does not have lung cancer.  No real complaints.  We discussed quitting smoking.  She says she has tried but she struggles to.  She rather do it on her own.  We discussed about taking quit smoking drugs but she does not want to at this point.  09/17/2023 Follow up ; COPD  Patient presents for 1 week follow-up.  Patient was seen last week for an acute COPD exacerbation in the setting of COVID-19 infection..  Last office visit she presented with 3-day history of fever cough and congestion with wheezing and decreased O2 saturations.  She was recommended for hospitalization however declined.  She was treated in the outpatient setting with Augmentin x 10 days and a prednisone taper for suspected superimposed bronchitis/early pneumonia.  She was started on molnupiravir.  She was given a Medrol injection.  Started on oxygen at 2 L.  Chest x-ray showed superimposed interstitial opacities in the lower lungs.  Lab work showed a normal WBC count.  Since last visit patient is feeling She continues smoke.  We discussed smoking cessation. Has few days left of Augmentin and Prednisone. Last dose of Molnupiravir is today. Remains on Trelegy daily.  Still smoking. Discussed smoking cessation .  She says she is feeling better.  Continues to get short of breath with walking.  O2 saturations required 2 L of oxygen at rest and 3 L of oxygen walking to maintain O2 saturations greater than 88 to 90%.  She was unable to keep her O2 saturations greater than 88% on POC device.  Patient remains out of work.  She works full-time 12-hour shifts.  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  She says  she is eating okay.  Drinking well.  No nausea vomiting or diarrhea. She denies any chest pain orthopnea calf pain, edema or hemoptysis.  No history of VTE.    TEST/EVENTS :  High-resolution CT chest August 25, 2021 is negative for ILD.  Mild emphysema, incidental finding of atherosclerosis and calcifications on the aortic valve   2D echo August 03, 2021 showed EF at 60-65%, grade 1 diastolic dysfunction, right ventricular systolic function is normal right ventricular size is normal aortic valve is normal no regurgitation and no or aortic valve stenosis.   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 , MRN: [id number] , ADDRESS: [address] [place] [address] PCP Lucianne Lei, MD Patient Care Team: Lucianne Lei, MD as PCP - General (Internal Medicine)  This Provider for this visit: Treatment Team:  Attending Provider: Kalman Shan, MD    10/01/2023 -   Chief Complaint  Patient presents with   Follow-up    Breathing improved some since the last visit. She has cough with yellow sputum. She believes she might have some wheezing.    ##Gold stage II COPD -on Trelegy  - Normal alpha-1 #Morbid obesity- continue trelegy daily scheduled #Active smoking; history greater than 100 pack #Lung cancer screening -per his statement of CT chest 2024 is without lung cancer.  -CT angiogram 35/5/25 without PE # COVID-19 09/12/2023  HPI SHAYLINN HLADIK 68 y.o. -returns for follow-up.  She was seen by nurse practitioner 09/12/2023 with fever.  She was diagnosed with COVID-19 in the office.  And also associated COPD exacerbation was.  Was given steroids given Augmentin.  Was given antiviral.  Then seen in 1 week follow-up  09/17/2023 she was hypoxemic.  She was advised admission but she refused because her husband is  wheelchair-bound and she has to take care of him and she also works full-time.  She had a CT angiogram that ruled out PE.  She is here for follow-up.  She says she is back to baseline and is doing well.  She needs a return to work note.  She was smelling of tobacco.  Advised her to quit smoking.  She says she will do it on her own.  She declined Wellbutrin and Zyban and calling the quit smoking cessation line because all these interventions have not helped her in the past.  I did discuss possibility of biologic injection again COPD and also Othuvayre if she keeps having recurrent exacerbations.  Did indicate to her this might be expensive.  She is open to the idea we will assess in the future.     Symptoms:  SYMPTOM SCALE - ILD 07/27/2021  Current weight   O2 use ra  Shortness of Breath 0 -> 5 scale with 5 being worst (score 6 If unable to do)  At rest 3  Simple tasks - showers, clothes change, eating, shaving 3  Household (dishes, doing bed, laundry) 4  Shopping 4  Walking level at own pace 3  Walking up Stairs 6  Total (30-36) Dyspnea Score 23  How bad is your cough? 3  How bad is your fatigue 4  How bad is nausea 3  How bad is vomiting?  0  How bad is diarrhea? 0  How bad is anxiety? 0  How bad is depression 00  Any chronic pain - if so where and how bad 0    Simple office walk 185 feet x  3 laps goal with forehead probe 07/27/2021    O2 used ra   Number laps completed 3   Comments about pace avg   Resting Pulse Ox/HR 97% and 89/min   Final Pulse Ox/HR 90% and 122/min   Desaturated </= 88% no   Desaturated <= 3% points Yes, 7 points   Got Tachycardic >/= 90/min yes   Symptoms at end of test Mod dyspnea   Miscellaneous comments x       IMPRESSION: No evidence of pulmonary embolism.   Subsegmental atelectasis versus scarring in the anterior right middle lobe.      Electronically Signed   By: Danae Orleans M.D.   On: 09/18/2023 14:24  PFT     Latest Ref Rng & Units 09/13/2021    8:28 AM  PFT Results  FVC-Pre L 1.91   FVC-Predicted Pre % 58   FVC-Post L 1.88   FVC-Predicted Post % 57   Pre FEV1/FVC % % 73   Post FEV1/FCV % % 74   FEV1-Pre L 1.40   FEV1-Predicted Pre % 55   FEV1-Post L 1.39   DLCO uncorrected ml/min/mmHg 10.75   DLCO UNC% % 52   DLCO corrected ml/min/mmHg 10.75   DLCO COR %Predicted % 52   DLVA Predicted % 79   TLC L 4.43   TLC % Predicted % 86   RV % Predicted % 102        LAB RESULTS last 96 hours No results found.       has no past medical history on file.   reports that she has been smoking cigarettes. She started smoking about 52 years ago. She has a 104.4 pack-year smoking history. She has never used smokeless tobacco.  No past surgical history on file.  Allergies  Allergen Reactions   Sulfa Antibiotics Rash  She breaks out.      Immunization History  Administered Date(s) Administered   Influenza, High Dose Seasonal PF 06/07/2021   Influenza,inj,Quad PF,6+ Mos 04/06/2022   Influenza-Unspecified 03/16/2018, 03/17/2023   Moderna Sars-Covid-2 Vaccination 09/25/2019, 10/21/2019, 08/31/2020    No family history on file.   Current Outpatient Medications:    albuterol (VENTOLIN HFA) 108 (90 Base) MCG/ACT inhaler, Inhale 1 puff into the lungs every 6 (six) hours as needed for wheezing or shortness of breath. Taking DAILY, Disp: , Rfl:    fexofenadine (ALLEGRA) 180 MG tablet, Take 180 mg by mouth daily., Disp: , Rfl:    fluticasone (FLONASE) 50 MCG/ACT nasal spray, Place 2 sprays into both nostrils daily., Disp: , Rfl:    Fluticasone-Umeclidin-Vilant (TRELEGY ELLIPTA) 200-62.5-25 MCG/ACT AEPB, Inhale 1 puff into the lungs daily., Disp: 1 each, Rfl: 11   furosemide (LASIX) 40 MG tablet, Take 40 mg by mouth daily., Disp: , Rfl:    ipratropium-albuterol (DUONEB) 0.5-2.5 (3) MG/3ML SOLN, Take  3 mLs by nebulization every 6 (six) hours as needed., Disp: , Rfl:    levothyroxine (SYNTHROID) 137 MCG tablet, Take 137 mcg by mouth every morning., Disp: , Rfl:    losartan-hydrochlorothiazide (HYZAAR) 50-12.5 MG tablet, Take 1 tablet by mouth daily., Disp: , Rfl:    montelukast (SINGULAIR) 10 MG tablet, Take 10 mg by mouth at bedtime., Disp: , Rfl:    Multiple Vitamin (MULTIVITAMIN) tablet, Take 1 tablet by mouth daily., Disp: , Rfl:    potassium chloride (KLOR-CON M) 10 MEQ tablet, Take 10 mEq by mouth daily., Disp: , Rfl:    rosuvastatin (CRESTOR) 20 MG tablet, Take 20 mg by mouth at bedtime., Disp: , Rfl:    Vitamin D, Ergocalciferol, (DRISDOL) 1.25 MG (50000 UNIT) CAPS capsule, Take 50,000 Units by mouth once a week., Disp: , Rfl:       Objective:   Vitals:   10/01/23 1102 10/01/23 1103  BP:  117/69  Pulse: 76   Temp:  98 F (36.7 C)  TempSrc:  Oral  SpO2: 90%   Weight:  236 lb 12.8 oz (107.4 kg)  Height:  5' 4.5" (1.638 m)    Estimated body mass index is 40.02 kg/m as calculated from the following:   Height as of this encounter: 5' 4.5" (1.638 m).   Weight as of this encounter: 236 lb 12.8 oz (107.4 kg).  @WEIGHTCHANGE @  Filed Weights   10/01/23 1103  Weight: 236 lb 12.8 oz (107.4 kg)     Physical Exam   General: No distress. Smells of tobacoo O2 at rest: no Cane present: no Sitting in wheel chair: no Frail: n Obese: yes Neuro: Alert and Oriented x 3. GCS 15. Speech normal Psych: Pleasant Resp:  Barrel Chest - no.  Wheeze - no, Crackles - no, No overt respiratory distress CVS: Normal heart sounds. Murmurs - no Ext: Stigmata of Connective Tissue Disease - no HEENT: Normal upper airway. PEERL +. No post nasal drip        Assessment:       ICD-10-CM   1. COVID-19 virus infection  U07.1 Pulmonary function test    2. Stage 2 moderate COPD by GOLD classification (HCC)  J44.9 Pulmonary function test    3. Smoking  F17.200 Pulmonary function test     4. Return to work exam  Z76.89 Pulmonary function test         Plan:     Patient Instructions  COVID-19 virus infection - late feb  2025  Resolved  Plan  - will get cxr at [facility] in several months at followup - take return to note work from CMA   Stage 2 moderate COPD by GOLD classification (HCC)  Stable  Plan  Continue trelegy and singulair Do pft in 6 months If repeated flare ups happen, can consider Othuvayre or dupixent   Smoking  Plan  - encourage quit smoking  - respect declining referral or zyban or chantix  Followup 6 months 15 min visit Dr Illene Silver     FOLLOWUP Return in about 6 months (around 04/02/2024) for 15 min visit, after Cleda Daub and DLCO, copd, with Dr Marchelle Gearing, Face to Face Visit.    SIGNATURE    Dr. Kalman Shan, M.D., F.C.C.P,  Pulmonary and Critical Care Medicine Staff Physician, [facility] Center Director - Interstitial Lung Disease  Program  Pulmonary Fibrosis [facility] [place], [place], [address]  Pager: [phone], If no answer or between  15:00h - 7:00h: call [phone] Telephone: [phone]  11:31 AM 10/01/2023

## 2023-10-03 NOTE — Telephone Encounter (Signed)
 MR has been given the forms  Please sign them, Dr. Marchelle Gearing

## 2023-10-04 NOTE — Telephone Encounter (Signed)
 Forms signed and faxed along with past 3 ov notes  Msg to pt to make her aware via mychart

## 2023-10-04 NOTE — Telephone Encounter (Signed)
 Verlon Au, there is lot of stuff there to fill. Can Darilyn.Byrd Hesselbach fill it and I sign? IS that not the process?

## 2023-10-10 DIAGNOSIS — Z1231 Encounter for screening mammogram for malignant neoplasm of breast: Secondary | ICD-10-CM | POA: Diagnosis not present

## 2023-10-10 DIAGNOSIS — N959 Unspecified menopausal and perimenopausal disorder: Secondary | ICD-10-CM | POA: Diagnosis not present

## 2023-10-11 DIAGNOSIS — J9601 Acute respiratory failure with hypoxia: Secondary | ICD-10-CM | POA: Diagnosis not present

## 2023-11-12 DIAGNOSIS — E559 Vitamin D deficiency, unspecified: Secondary | ICD-10-CM | POA: Diagnosis not present

## 2023-11-12 DIAGNOSIS — I10 Essential (primary) hypertension: Secondary | ICD-10-CM | POA: Diagnosis not present

## 2023-11-12 DIAGNOSIS — E039 Hypothyroidism, unspecified: Secondary | ICD-10-CM | POA: Diagnosis not present

## 2023-11-12 DIAGNOSIS — E782 Mixed hyperlipidemia: Secondary | ICD-10-CM | POA: Diagnosis not present

## 2023-12-23 NOTE — Telephone Encounter (Signed)
 Will need ov to reassess O2 needs.

## 2023-12-24 ENCOUNTER — Encounter (HOSPITAL_BASED_OUTPATIENT_CLINIC_OR_DEPARTMENT_OTHER): Payer: Self-pay

## 2023-12-24 ENCOUNTER — Ambulatory Visit (HOSPITAL_BASED_OUTPATIENT_CLINIC_OR_DEPARTMENT_OTHER)
Admission: RE | Admit: 2023-12-24 | Discharge: 2023-12-24 | Disposition: A | Discharge status: Home / Self Care | Source: Ambulatory Visit | Attending: Family Medicine | Admitting: Family Medicine

## 2023-12-24 ENCOUNTER — Ambulatory Visit (INDEPENDENT_AMBULATORY_CARE_PROVIDER_SITE_OTHER)
Admit: 2023-12-24 | Discharge: 2023-12-24 | Disposition: A | Discharge status: Home / Self Care | Attending: Family Medicine | Admitting: Family Medicine

## 2023-12-24 ENCOUNTER — Other Ambulatory Visit (HOSPITAL_BASED_OUTPATIENT_CLINIC_OR_DEPARTMENT_OTHER): Payer: Self-pay

## 2023-12-24 VITALS — BP 108/59 | HR 67 | Temp 98.7°F | Resp 26

## 2023-12-24 DIAGNOSIS — R051 Acute cough: Secondary | ICD-10-CM

## 2023-12-24 DIAGNOSIS — R0902 Hypoxemia: Secondary | ICD-10-CM | POA: Diagnosis not present

## 2023-12-24 DIAGNOSIS — R918 Other nonspecific abnormal finding of lung field: Secondary | ICD-10-CM | POA: Diagnosis not present

## 2023-12-24 DIAGNOSIS — R0989 Other specified symptoms and signs involving the circulatory and respiratory systems: Secondary | ICD-10-CM | POA: Diagnosis not present

## 2023-12-24 DIAGNOSIS — J810 Acute pulmonary edema: Secondary | ICD-10-CM | POA: Diagnosis not present

## 2023-12-24 DIAGNOSIS — J441 Chronic obstructive pulmonary disease with (acute) exacerbation: Secondary | ICD-10-CM | POA: Diagnosis not present

## 2023-12-24 DIAGNOSIS — R059 Cough, unspecified: Secondary | ICD-10-CM | POA: Diagnosis not present

## 2023-12-24 MED ORDER — PREDNISONE 20 MG PO TABS
20.0000 mg | ORAL_TABLET | Freq: Every day | ORAL | 0 refills | Status: AC
  Filled 2023-12-24: qty 5, 5d supply, fill #0

## 2023-12-24 MED ORDER — IPRATROPIUM-ALBUTEROL 0.5-2.5 (3) MG/3ML IN SOLN
3.0000 mL | Freq: Once | RESPIRATORY_TRACT | Status: AC
  Administered 2023-12-24: 3 mL via RESPIRATORY_TRACT

## 2023-12-24 MED ORDER — POTASSIUM CHLORIDE CRYS ER 10 MEQ PO TBCR: 10.0000 meq | EXTENDED_RELEASE_TABLET | Freq: Every day | ORAL | 0 refills | Status: DC

## 2023-12-24 NOTE — ED Provider Notes (Signed)
 [facility]    CSN: [id number] Arrival date & time: 12/24/23  9562      History   Chief Complaint Chief Complaint  Patient presents with   Wheezing    Congested, very tired, no appetite. My head feels strange. - Entered by patient    HPI Lisa Navarro is a 68 y.o. female.   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  She sees Dr. Bertrum Brodie, pulmonology and grains..  She is on inhaler and had pneumonia in January 2025.  She refused hospitalization but did get home oxygen .  She stopped using the home oxygen  when she felt better.  She has been under the impression, her own impression not anything directed by medical, that oxygen  levels in the 70s for her normal and she should only get concerned when they are in the 50s which they have been at times.  She has had some headaches that have been super fatigued lately.  She developed a cough and congestion on 12/17/2023 or earlier.  She is short of breath at rest and worse with any activity.  She has had some oxygen  levels in the 50s this last week.  She has a home nebulizer machine and she started using the nebulizer machine on 12/21/2023 up to at least 4 times daily.  She feels like that has helped some and her oxygen  levels came up to consistently in the 70s.  She does not have any home oxygen  at this time.  She is the sole caregiver of her husband who has many health problems.  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  She sees Dr. Tamela Fake as her primary care doctor.   Wheezing Associated symptoms: chest tightness, cough, rhinorrhea and shortness of breath   Associated symptoms: no chest pain, no ear pain, no fever, no rash and no sore throat     History reviewed. No pertinent past medical history.  Patient Active Problem List   Diagnosis Date Noted   COVID-19 virus infection 09/17/2023   Pneumonia due to COVID-19 virus 09/17/2023    COPD with acute exacerbation (HCC) 09/12/2023   Respiratory failure, acute (HCC) 09/12/2023   Chronic rhinitis 05/07/2022   COPD with emphysema (HCC) 09/14/2021   Tobacco abuse 09/14/2021    History reviewed. No pertinent surgical history.  OB History   No obstetric history on file.      Home Medications    Prior to Admission medications   Medication Sig Start Date End Date Taking? Authorizing Provider  predniSONE  (DELTASONE ) 20 MG tablet Take 1 tablet (20 mg total) by mouth daily with breakfast for 5 days. 12/24/23 12/29/23 Yes Guss Legacy, FNP  albuterol (VENTOLIN HFA) 108 (90 Base) MCG/ACT inhaler Inhale 1 puff into the lungs every 6 (six) hours as needed for wheezing or shortness of breath. Taking DAILY    [provider]  fexofenadine (ALLEGRA) 180 MG tablet Take 180 mg by mouth daily.    [provider]  fluticasone (FLONASE) 50 MCG/ACT nasal spray Place 2 sprays into both nostrils daily. 11/08/22   [provider]  Fluticasone-Umeclidin-Vilant (TRELEGY ELLIPTA ) 200-62.5-25 MCG/ACT AEPB Inhale 1 puff into the lungs daily. 09/12/23   Parrett, Macdonald Savoy, NP  furosemide (LASIX) 40 MG tablet Take 40 mg by mouth daily. 04/18/22   [provider]  ipratropium-albuterol (DUONEB) 0.5-2.5 (3) MG/3ML SOLN Take 3 mLs by nebulization  every 6 (six) hours as needed.    [provider]  levothyroxine (SYNTHROID) 137 MCG tablet Take 137 mcg by mouth every morning. 08/12/23   [provider]  losartan-hydrochlorothiazide (HYZAAR) 50-12.5 MG tablet Take 1 tablet by mouth daily. 04/18/22   [provider]  montelukast (SINGULAIR) 10 MG tablet Take 10 mg by mouth at bedtime. 08/14/22   [provider]  Multiple Vitamin (MULTIVITAMIN) tablet Take 1 tablet by mouth daily.    [provider]  potassium chloride (KLOR-CON M) 10 MEQ tablet Take 1 tablet (10 mEq total) by mouth daily. 12/24/23 01/23/24  Guss Legacy, FNP   rosuvastatin (CRESTOR) 20 MG tablet Take 20 mg by mouth at bedtime. 03/06/22   [provider]  Vitamin D, Ergocalciferol, (DRISDOL) 1.25 MG (50000 UNIT) CAPS capsule Take 50,000 Units by mouth once a week. 03/06/22   [provider]    Family History History reviewed. No pertinent family history.  Social History Social History   Tobacco Use   Smoking status: Every Day    Current packs/day: 2.00    Average packs/day: 2.0 packs/day for 52.4 years (104.9 ttl pk-yrs)    Types: Cigarettes    Start date: 1973   Smokeless tobacco: Never   Tobacco comments:    Currently smoking half a pack daily  09/17/2023.     Allergies   Sulfa antibiotics   Review of Systems Review of Systems  Constitutional:  Negative for chills and fever.  HENT:  Positive for congestion, postnasal drip and rhinorrhea. Negative for ear pain and sore throat.   Eyes:  Negative for pain and visual disturbance.  Respiratory:  Positive for apnea, cough, chest tightness, shortness of breath and wheezing.   Cardiovascular:  Negative for chest pain and palpitations.  Gastrointestinal:  Negative for abdominal pain, constipation, diarrhea, nausea and vomiting.  Genitourinary:  Negative for dysuria and hematuria.  Musculoskeletal:  Negative for arthralgias and back pain.  Skin:  Negative for color change and rash.  Neurological:  Negative for seizures and syncope.  All other systems reviewed and are negative.    Physical Exam Triage Vital Signs ED Triage Vitals  Encounter Vitals Group     BP 12/24/23 0907 (!) 108/59     Systolic BP Percentile --      Diastolic BP Percentile --      Pulse Rate 12/24/23 0907 67     Resp 12/24/23 0907 (!) 26     Temp 12/24/23 0907 98.7 F (37.1 C)     Temp Source 12/24/23 0907 Oral     SpO2 12/24/23 0905 (!) 76 %     Weight --      Height --      Head Circumference --      Peak Flow --      Pain Score --      Pain Loc --      Pain Education --      Exclude  from Growth Chart --    No data found.  Updated Vital Signs BP (!) 108/59 (BP Location: Right Arm)   Pulse 67   Temp 98.7 F (37.1 C) (Oral)   Resp (!) 26   SpO2 92%   Visual Acuity Right Eye Distance:   Left Eye Distance:   Bilateral Distance:    Right Eye Near:   Left Eye Near:    Bilateral Near:     Physical Exam Vitals and nursing note reviewed.  Constitutional:  General: She is in acute distress.     Appearance: She is well-developed. She is ill-appearing. She is not toxic-appearing.  HENT:     Head: Normocephalic and atraumatic.     Right Ear: Hearing, tympanic membrane, ear canal and external ear normal.     Left Ear: Hearing, tympanic membrane, ear canal and external ear normal.     Nose: No congestion or rhinorrhea.     Right Sinus: No maxillary sinus tenderness or frontal sinus tenderness.     Left Sinus: No maxillary sinus tenderness or frontal sinus tenderness.     Mouth/Throat:     Lips: Pink.     Mouth: Mucous membranes are moist.     Pharynx: Uvula midline. No oropharyngeal exudate or posterior oropharyngeal erythema.     Tonsils: No tonsillar exudate.  Eyes:     Conjunctiva/sclera: Conjunctivae normal.     Pupils: Pupils are equal, round, and reactive to light.  Cardiovascular:     Rate and Rhythm: Normal rate and regular rhythm.     Heart sounds: S1 normal and S2 normal. No murmur heard. Pulmonary:     Effort: Respiratory distress present.     Breath sounds: Decreased air movement present. Examination of the right-upper field reveals decreased breath sounds, wheezing and rhonchi. Examination of the left-upper field reveals decreased breath sounds, wheezing and rhonchi. Examination of the right-middle field reveals decreased breath sounds and wheezing. Examination of the left-middle field reveals decreased breath sounds and wheezing. Examination of the right-lower field reveals decreased breath sounds and wheezing. Examination of the left-lower field  reveals decreased breath sounds and wheezing. Decreased breath sounds, wheezing and rhonchi present. No rales.     Comments: Reassessment after DuoNeb treatment: Oxygen  saturation is up to 91% but she is also on oxygen  at 4 L/min.  Breath sounds are improved throughout but still diminished throughout.  Rhonchi were not heard after the DuoNeb treatment.  She still has some inspiratory expiratory wheezes but they were decreased in frequency after the DuoNeb treatment. Abdominal:     General: Bowel sounds are normal.     Palpations: Abdomen is soft.     Tenderness: There is no abdominal tenderness.  Musculoskeletal:        General: No swelling.     Cervical back: Neck supple.  Lymphadenopathy:     Head:     Right side of head: No submental, submandibular, tonsillar, preauricular or posterior auricular adenopathy.     Left side of head: No submental, submandibular, tonsillar, preauricular or posterior auricular adenopathy.     Cervical: Cervical adenopathy present.     Right cervical: Superficial cervical adenopathy present.     Left cervical: Superficial cervical adenopathy present.  Skin:    General: Skin is warm and dry.     Capillary Refill: Capillary refill takes less than 2 seconds.     Findings: No rash.  Neurological:     Mental Status: She is alert and oriented to person, place, and time.  Psychiatric:        Mood and Affect: Mood normal.      UC Treatments / Results  Labs (all labs ordered are listed, but only abnormal results are displayed) Labs Reviewed - No data to display  EKG   Radiology DG Chest 2 View Result Date: 12/24/2023 CLINICAL DATA:  Cough, hypoxia. EXAM: CHEST - 2 VIEW COMPARISON:  September 12, 2023. FINDINGS: Mild cardiomegaly is noted with mild central pulmonary vascular congestion. Minimal pulmonary edema may be  present. No consolidative process is noted. Bony thorax is unremarkable. IMPRESSION: Mild cardiomegaly with mild central pulmonary vascular  congestion and possible minimal pulmonary edema. Electronically Signed   By: Rosalene Colon M.D.   On: 12/24/2023 10:08    Procedures Procedures (including critical care time)  Medications Ordered in UC Medications  ipratropium-albuterol (DUONEB) 0.5-2.5 (3) MG/3ML nebulizer solution 3 mL (3 mLs Nebulization Given 12/24/23 0939)    Initial Impression / Assessment and Plan / UC Course  I have reviewed the triage vital signs and the nursing notes.  Pertinent labs & imaging results that were available during my care of the patient were reviewed by me and considered in my medical decision making (see chart for details).  Plan of Care: COPD exacerbation with cough, hypoxia and pulmonary edema: Chest x-ray showed chronic pulmonary vascular congestion, cardiomegaly and mild pulmonary edema.  No consolidation and no pneumonia.  Patient is already on furosemide 20 mg daily with potassium chloride 10 mEq daily for pulmonary edema.  Encouraged to take furosemide 20 mg and potassium chloride 10 mEq twice daily for 3 days (at 8 AM).  Added prednisone  20 mg daily for 5 days.  Continue home nebulizer treatments and other inhalers.  I spoke with the triage RN Aurelia Blotter, RN) at the primary care office.  I updated her on the patient's condition and hypoxia.  I advised that we will be sending a note.  I asked if the patient could get seen urgently this week and that they could possibly help her get home oxygen  or from a local oxygen  supplier ([facility]).  Ms. Debbie Fails felt that that was very possible and asked for the patient to contact the office today or tomorrow.  Did advise her that I was instructing the patient to go to the hospital but the patient refused.  I have sent a note to Dr. Bertrum Brodie (pulmonology).  The patient was also advised to contact that office.  They are in our system and can review my note and the chest x-ray.  Patient may need adjustments from pulmonology.  We  Long discussion  with the patient about hospitalization and low oxygenation.  Discussed the risk of respiratory arrest.  Patient reported that she was much sicker earlier in the year but she just cannot go to the hospital because she is the caregiver for her husband and she is the breadwinner for her family.  They cannot do without her income.  She refused to consider going to the hospital today.  Follow-up as needed.  I reviewed the plan of care with the patient and/or the patient's guardian.  The patient and/or guardian had time to ask questions and acknowledged that the questions were answered.  I provided instruction on symptoms or reasons to return here or to go to an ER, if symptoms/condition did not improve, worsened or if new symptoms occurred.   I spent 45 minutes on patient care, including face to face time and care planning/patient management.  Final Clinical Impressions(s) / UC Diagnoses   Final diagnoses:  COPD exacerbation (HCC)  Acute cough  Hypoxia  Acute pulmonary edema [facility])     Discharge Instructions      Patient has an acute COPD exacerbation with secondary pulmonary edema, cough and hypoxia.  Chest x-ray shows pulmonary edema, cardiomegaly and pulmonary vascular congestion.  No consolidation or pneumonia seen.  Prednisone  20 mg daily for 5 days.  Increase furosemide to 20 mg twice daily (at 8 AM and noon)  for 3 days and take potassium chloride, 10 mEq twice daily with the furosemide dosing for the 3 days.  Continue current inhalers and home nebulizer treatment.  Patient is significantly hypoxic without oxygen .  She refuses to consider going to an emergency department or hospital.  She is aware of the risk that hypoxia could lead to respiratory arrest and death.  She was advised to leave here and go to an emergency room and refused.  She will go home and talk to both her primary care and pulmonologist.  I did call and give a specific update to her primary care triage nurse, Aurelia Blotter, RN.   The triage nurse said that the patient would be given an acute appointment this week and they would work with her primary care provider to try to get her home oxygen  if pulmonology could not get it done first.  Follow-up with primary care and pulmonology.  Follow-up here as needed.   ED Prescriptions     Medication Sig Dispense Auth. Provider   potassium chloride (KLOR-CON M) 10 MEQ tablet Take 1 tablet (10 mEq total) by mouth daily. 30 tablet Lounell Schumacher, FNP   predniSONE  (DELTASONE ) 20 MG tablet Take 1 tablet (20 mg total) by mouth daily with breakfast for 5 days. 5 tablet Joie Reamer, FNP      PDMP not reviewed this encounter.   Guss Legacy, FNP 12/24/23 1041

## 2023-12-24 NOTE — ED Triage Notes (Signed)
 Pt c/o SOB at rest, worse with exertion x1 week. States she has been wheezing, fatigued, and had no appetite. Also states her head "feels funny." Has used inhalers and neb treatments. Hx of COPD. On arrival to treatment room, pt is labored from ambulation; sats 76% on RA. Pt states this was better than last night, during which she states it was 50's-60's. Does not have home O2. States she is unable to go to the ER because she is the sole caregiver for her husband. Pt states because she has COPD her O2 sat is ok in the 70's - education given to pt at the time for O2 goals for COPD pts. Pt placed on 3L [place] with sats increased to mid 80's. Eldonna Greenspan, FNP notified.

## 2023-12-24 NOTE — Discharge Instructions (Signed)
 Patient has an acute COPD exacerbation with secondary pulmonary edema, cough and hypoxia.  Chest x-ray shows pulmonary edema, cardiomegaly and pulmonary vascular congestion.  No consolidation or pneumonia seen.  Prednisone  20 mg daily for 5 days.  Increase furosemide to 20 mg twice daily (at 8 AM and noon) for 3 days and take potassium chloride, 10 mEq twice daily with the furosemide dosing for the 3 days.  Continue current inhalers and home nebulizer treatment.  Patient is significantly hypoxic without oxygen .  She refuses to consider going to an emergency department or hospital.  She is aware of the risk that hypoxia could lead to respiratory arrest and death.  She was advised to leave here and go to an emergency room and refused.  She will go home and talk to both her primary care and pulmonologist.  I did call and give a specific update to her primary care triage nurse, Aurelia Blotter, RN.  The triage nurse said that the patient would be given an acute appointment this week and they would work with her primary care provider to try to get her home oxygen  if pulmonology could not get it done first.  Follow-up with primary care and pulmonology.  Follow-up here as needed.

## 2023-12-25 ENCOUNTER — Telehealth: Payer: Self-pay

## 2023-12-25 DIAGNOSIS — J449 Chronic obstructive pulmonary disease, unspecified: Secondary | ICD-10-CM | POA: Diagnosis not present

## 2023-12-25 DIAGNOSIS — M7989 Other specified soft tissue disorders: Secondary | ICD-10-CM | POA: Diagnosis not present

## 2023-12-25 DIAGNOSIS — J45909 Unspecified asthma, uncomplicated: Secondary | ICD-10-CM | POA: Diagnosis not present

## 2023-12-25 DIAGNOSIS — R0602 Shortness of breath: Secondary | ICD-10-CM | POA: Diagnosis not present

## 2023-12-25 DIAGNOSIS — Z09 Encounter for follow-up examination after completed treatment for conditions other than malignant neoplasm: Secondary | ICD-10-CM | POA: Diagnosis not present

## 2023-12-25 NOTE — Telephone Encounter (Signed)
 Copied from CRM [id number]. Topic: Clinical - Medical Advice >> Dec 24, 2023 12:04 PM Jethro Morrison wrote: Reason for CRM: PT CALLED STATED SHE WENT TO URGENT CARE THIS MORNING STATED THEY COULD NOT GET HER OXYGEN . I DID ADVISE HER OF THE NOTE FROM TAMMY. PT STATED THEY DID A CHEST XRAY WHILE SHE WAS THERE ALSO THEY PUT HER ON OXYGEN  WHILE SHE WAS THERE ALSO STATED HER OXYGEN  LEVELS ARE BETWEEN 50 AND 60. PT STATED NOT ABLE TO GO TO ER BECAUSE SHE TAKES CARE OF HER HUSBAND. PT IS REQUESTING OXYGEN  FOR HER HOME  [facility] home number rang and heard nothing and tried cell mailbox is full . Will send patient a mychart message .

## 2024-01-09 DIAGNOSIS — J449 Chronic obstructive pulmonary disease, unspecified: Secondary | ICD-10-CM | POA: Diagnosis not present

## 2024-01-09 DIAGNOSIS — Z79899 Other long term (current) drug therapy: Secondary | ICD-10-CM | POA: Diagnosis not present

## 2024-01-09 DIAGNOSIS — E039 Hypothyroidism, unspecified: Secondary | ICD-10-CM | POA: Diagnosis not present

## 2024-01-09 DIAGNOSIS — E538 Deficiency of other specified B group vitamins: Secondary | ICD-10-CM | POA: Diagnosis not present

## 2024-01-09 DIAGNOSIS — R739 Hyperglycemia, unspecified: Secondary | ICD-10-CM | POA: Diagnosis not present

## 2024-01-09 DIAGNOSIS — E782 Mixed hyperlipidemia: Secondary | ICD-10-CM | POA: Diagnosis not present

## 2024-01-09 DIAGNOSIS — E559 Vitamin D deficiency, unspecified: Secondary | ICD-10-CM | POA: Diagnosis not present

## 2024-01-28 DIAGNOSIS — B372 Candidiasis of skin and nail: Secondary | ICD-10-CM | POA: Diagnosis not present

## 2024-01-28 DIAGNOSIS — E876 Hypokalemia: Secondary | ICD-10-CM | POA: Diagnosis not present

## 2024-01-28 DIAGNOSIS — Z9181 History of falling: Secondary | ICD-10-CM | POA: Diagnosis not present

## 2024-01-28 DIAGNOSIS — J449 Chronic obstructive pulmonary disease, unspecified: Secondary | ICD-10-CM | POA: Diagnosis not present

## 2024-01-28 DIAGNOSIS — Z23 Encounter for immunization: Secondary | ICD-10-CM | POA: Diagnosis not present

## 2024-01-28 IMAGING — CT CT CHEST HIGH RESOLUTION
2 of 8 series · 14 of 36 positions shown, 17 images · non-contrast
Comparison: No priors.

CLINICAL DATA: 65-year-old female with history of dyspnea on
exertion. Chronic shortness of breath. History of COPD.



[Series 5: high resolution · axial · 0.66mm/px · z∈[-288,-42]mm · 11 of 297 slices shown, 14 images]
[im 25/297  mediastinal]
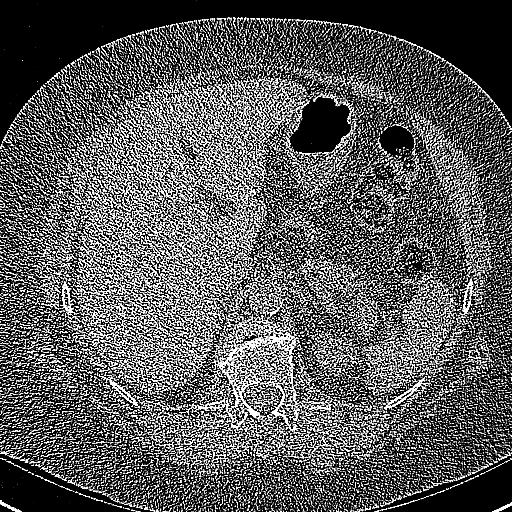
[im 25/297  lung]
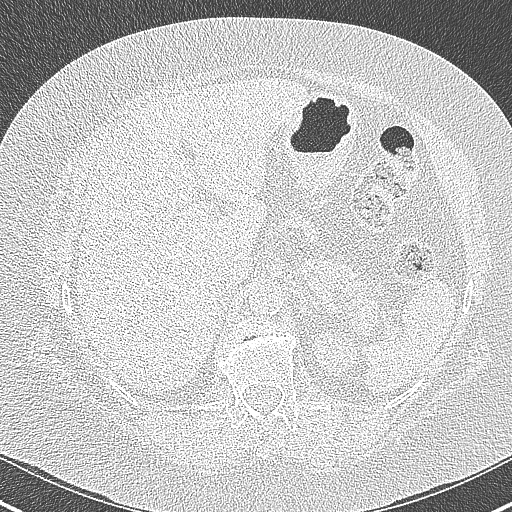
[im 50/297  lung]
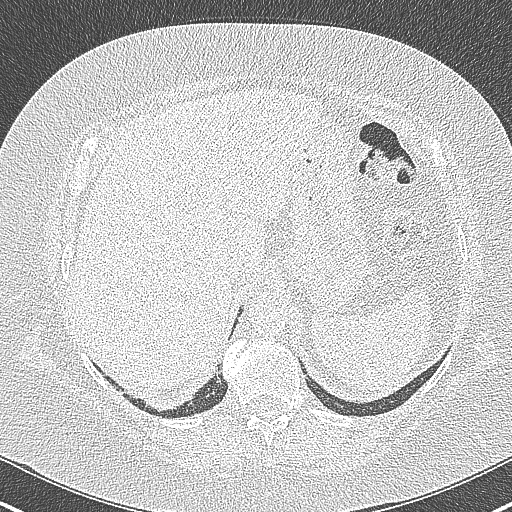
[im 75/297  lung]
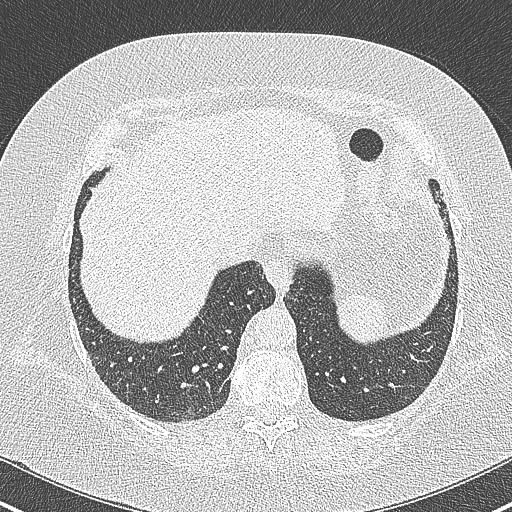
[im 99/297  lung]
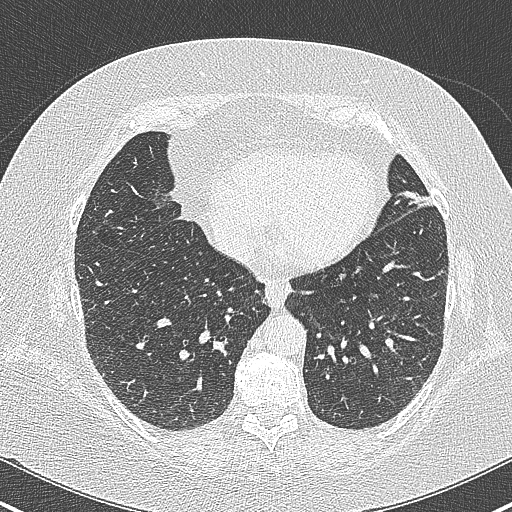
[im 124/297  mediastinal]
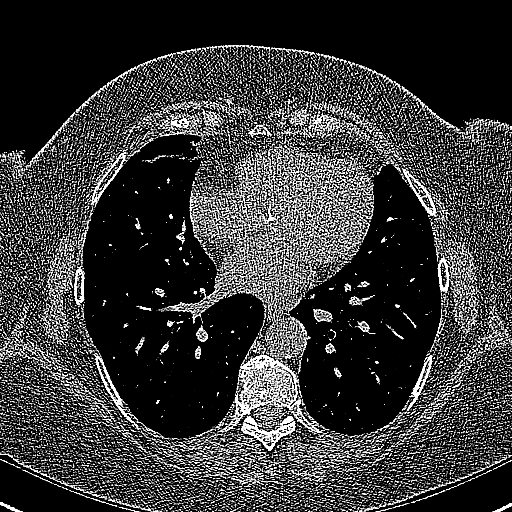
[im 124/297  lung]
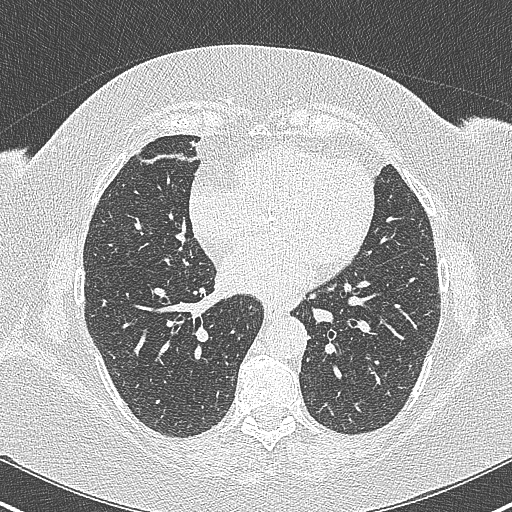
[im 149/297  lung]
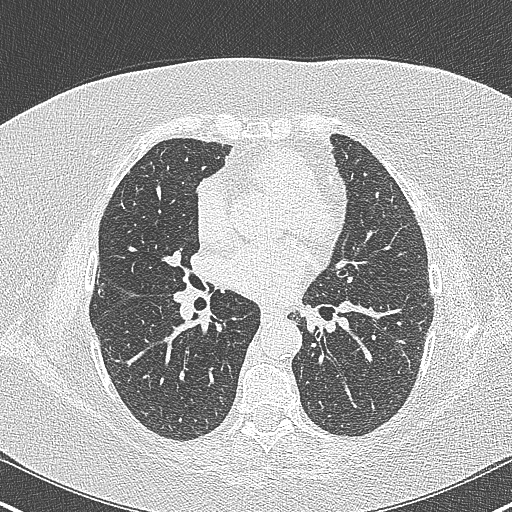
[im 173/297  lung]
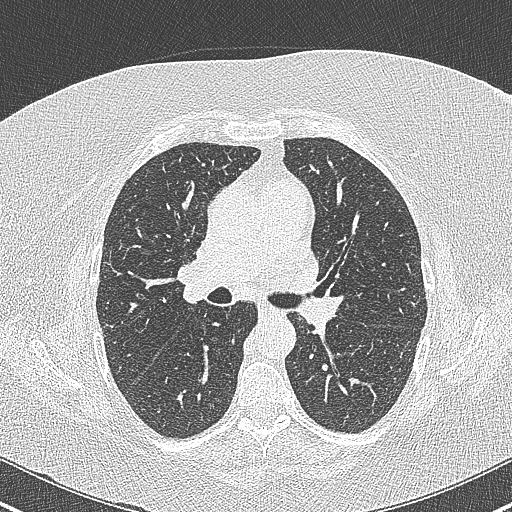
[im 198/297  lung]
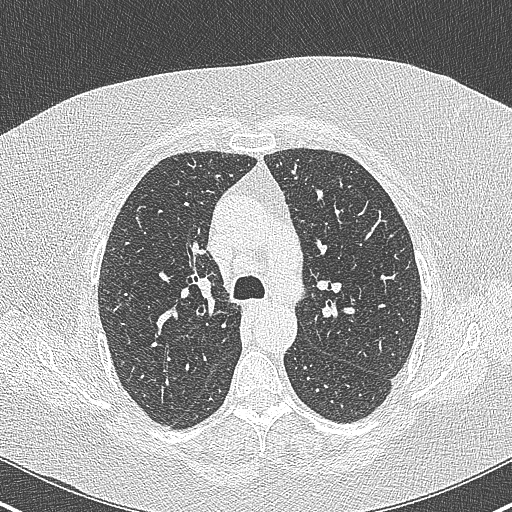
[im 223/297  mediastinal]
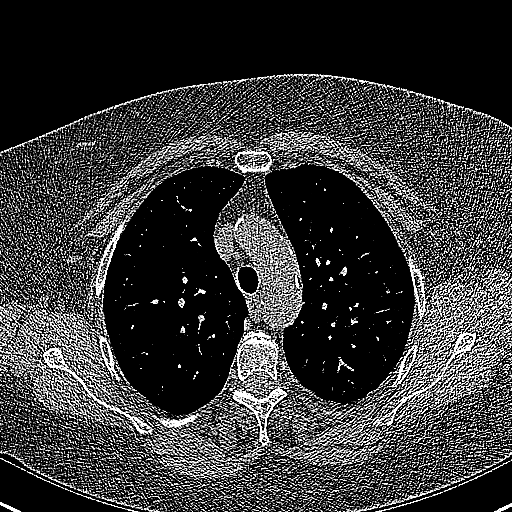
[im 223/297  lung]
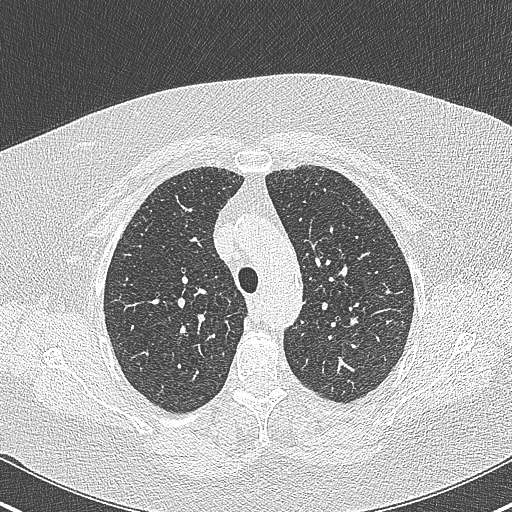
[im 247/297  lung]
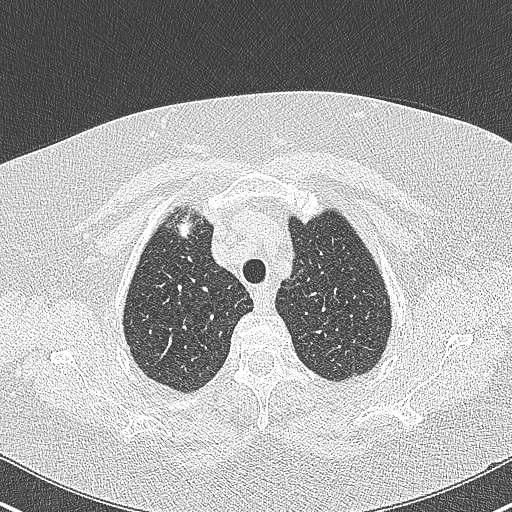
[im 272/297  lung]
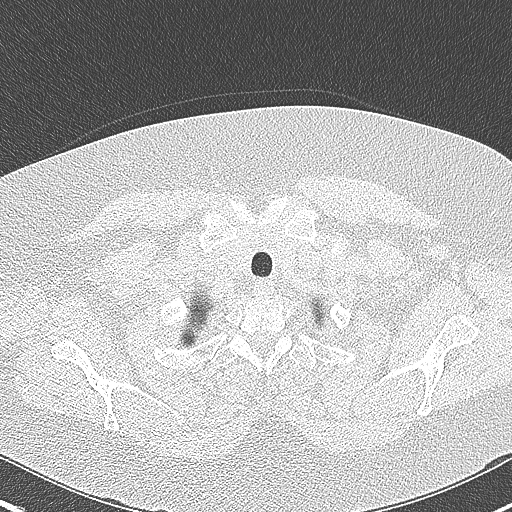

[Series 7: coronal · coronal · 0.59mm/px · 3 of 128 slices shown]
[im 26/128  lung]
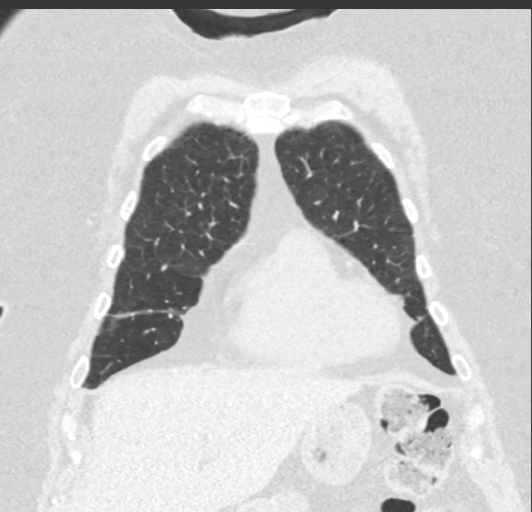
[im 51/128  lung]
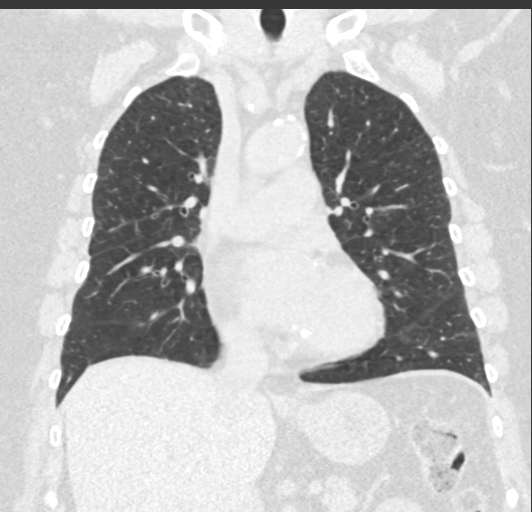
[im 77/128  lung]
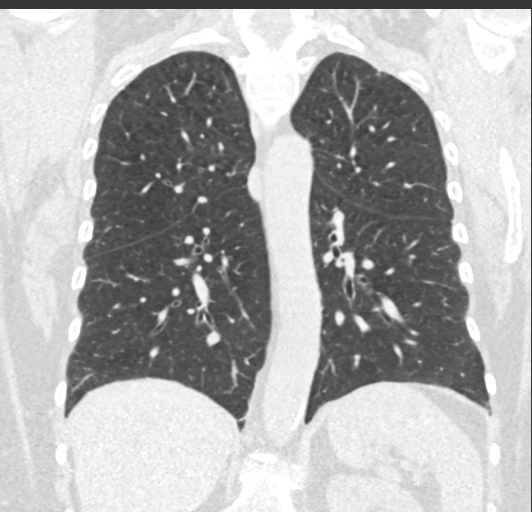

[14 of 36 positions shown; findings below may reference images not displayed]

FINDINGS: Cardiovascular: Heart size is normal. There is no significant
pericardial fluid, thickening or pericardial calcification. There is
aortic atherosclerosis, as well as atherosclerosis of the great
vessels of the mediastinum and the coronary arteries, including
calcified atherosclerotic plaque in the left main, left anterior
descending, left circumflex and right coronary arteries.
Calcifications of the aortic valve.

Mediastinum/Nodes: No pathologically enlarged mediastinal or hilar
lymph nodes. Please note that accurate exclusion of hilar adenopathy
is limited on noncontrast CT scans. Esophagus is unremarkable in
appearance. No axillary lymphadenopathy.

Lungs/Pleura: High-resolution images demonstrate no significant
regions of ground-glass attenuation, septal thickening, subpleural
reticulation, traction bronchiectasis or honeycombing to indicate
interstitial lung disease. Inspiratory and expiratory imaging
demonstrates very mild air trapping indicative of mild small airways
disease. Diffuse bronchial wall thickening with mild centrilobular
and paraseptal emphysema is also noted. A few scattered tiny 2-3 mm
pulmonary nodules are noted in the lungs bilaterally, nonspecific,
but statistically likely benign. No other suspicious appearing
pulmonary nodules or masses are noted.

Upper Abdomen: Aortic atherosclerosis.

Musculoskeletal: There are no aggressive appearing lytic or blastic
lesions noted in the visualized portions of the skeleton.
IMPRESSION: 1. No findings to suggest interstitial lung disease.
2. Mild diffuse bronchial wall thickening with mild centrilobular
and paraseptal emphysema. There is also evidence of mild air
trapping indicative of mild small airways disease. These findings
are compatible with reported clinical history of COPD.
3. Aortic atherosclerosis, in addition to left main and three-vessel
coronary artery disease. Please note that although the presence of
coronary artery calcium documents the presence of coronary artery
disease, the severity of this disease and any potential stenosis
cannot be assessed on this non-gated CT examination. Assessment for
potential risk factor modification, dietary therapy or pharmacologic
therapy may be warranted, if clinically indicated.
4. There are calcifications of the aortic valve. Echocardiographic
correlation for evaluation of potential valvular dysfunction may be
warranted if clinically indicated.

Aortic Atherosclerosis (1KIZM-YE3.3) and Emphysema (1KIZM-J98.A).

## 2024-02-24 DIAGNOSIS — J45909 Unspecified asthma, uncomplicated: Secondary | ICD-10-CM | POA: Diagnosis not present

## 2024-02-24 DIAGNOSIS — J449 Chronic obstructive pulmonary disease, unspecified: Secondary | ICD-10-CM | POA: Diagnosis not present

## 2024-03-26 DIAGNOSIS — J449 Chronic obstructive pulmonary disease, unspecified: Secondary | ICD-10-CM | POA: Diagnosis not present

## 2024-03-26 DIAGNOSIS — J45909 Unspecified asthma, uncomplicated: Secondary | ICD-10-CM | POA: Diagnosis not present

## 2024-03-30 ENCOUNTER — Ambulatory Visit: Admitting: Internal Medicine

## 2024-03-30 ENCOUNTER — Ambulatory Visit: Admitting: Adult Health

## 2024-03-30 ENCOUNTER — Encounter: Payer: Self-pay | Admitting: Adult Health

## 2024-03-30 ENCOUNTER — Telehealth: Payer: Self-pay

## 2024-03-30 VITALS — BP 108/72 | HR 77 | Temp 98.2°F | Ht 64.0 in | Wt 245.0 lb

## 2024-03-30 DIAGNOSIS — Z7689 Persons encountering health services in other specified circumstances: Secondary | ICD-10-CM

## 2024-03-30 DIAGNOSIS — F17210 Nicotine dependence, cigarettes, uncomplicated: Secondary | ICD-10-CM

## 2024-03-30 DIAGNOSIS — Z720 Tobacco use: Secondary | ICD-10-CM

## 2024-03-30 DIAGNOSIS — IMO0001 Reserved for inherently not codable concepts without codable children: Secondary | ICD-10-CM

## 2024-03-30 DIAGNOSIS — J449 Chronic obstructive pulmonary disease, unspecified: Secondary | ICD-10-CM

## 2024-03-30 DIAGNOSIS — Z9981 Dependence on supplemental oxygen: Secondary | ICD-10-CM | POA: Diagnosis not present

## 2024-03-30 DIAGNOSIS — J439 Emphysema, unspecified: Secondary | ICD-10-CM

## 2024-03-30 DIAGNOSIS — J9611 Chronic respiratory failure with hypoxia: Secondary | ICD-10-CM | POA: Diagnosis not present

## 2024-03-30 DIAGNOSIS — U071 COVID-19: Secondary | ICD-10-CM | POA: Diagnosis not present

## 2024-03-30 LAB — PULMONARY FUNCTION TEST
DL/VA % pred: 61 %
DL/VA: 2.58 ml/min/mmHg/L
DLCO cor % pred: 35 %
DLCO cor: 7.13 ml/min/mmHg
DLCO unc % pred: 35 %
DLCO unc: 7.13 ml/min/mmHg
FEF 25-75 Pre: 0.45 L/s
FEF2575-%Pred-Pre: 21 %
FEV1-%Pred-Pre: 36 %
FEV1-Pre: 0.88 L
FEV1FVC-%Pred-Pre: 84 %
FEV6-%Pred-Pre: 44 %
FEV6-Pre: 1.35 L
FEV6FVC-%Pred-Pre: 103 %
FVC-%Pred-Pre: 42 %
FVC-Pre: 1.35 L
Pre FEV1/FVC ratio: 65 %
Pre FEV6/FVC Ratio: 99 %
RV % pred: 144 %
RV: 3.02 L
TLC % pred: 87 %
TLC: 4.44 L

## 2024-03-30 MED ORDER — FLUTICASONE-SALMETEROL 500-50 MCG/ACT IN AEPB: 1.0000 | INHALATION_SPRAY | Freq: Two times a day (BID) | RESPIRATORY_TRACT | 11 refills | Status: DC

## 2024-03-30 MED ORDER — ALBUTEROL SULFATE HFA 108 (90 BASE) MCG/ACT IN AERS: 1.0000 | INHALATION_SPRAY | Freq: Four times a day (QID) | RESPIRATORY_TRACT | 4 refills | Status: AC | PRN

## 2024-03-30 MED ORDER — TRELEGY ELLIPTA 200-62.5-25 MCG/ACT IN AEPB: INHALATION_SPRAY | RESPIRATORY_TRACT | Status: DC

## 2024-03-30 NOTE — Patient Instructions (Signed)
 Spirometry/DLCO and lung volumes performed today,.

## 2024-03-30 NOTE — Progress Notes (Signed)
 Spirometry/DLCO and lung volumes performed today,.

## 2024-03-30 NOTE — Progress Notes (Signed)
 @Patient  ID: Lisa Navarro, female    DOB: 12/07/1955, 68 y.o.   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  Mild emphysema, incidental finding of atherosclerosis and calcifications on the aortic valve   2D echo August 03, 2021 showed EF at 60-65%, grade 1 diastolic dysfunction, right ventricular systolic function is normal right ventricular size is normal aortic valve is normal no regurgitation and no or aortic valve stenosis.   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  History of Present Illness Lisa Navarro is a 68 year old female with severe COPD and emphysema who presents for a checkup and breathing test. She experiences fluctuations in her breathing, describing 'good days and bad days'.  She is on Trelegy inhaler nail but says the cost of her medication is become an issue. She is on Medicare, which has impacted the cost of her medications. She was taking Trelegy once a day, but the cost increased significantly after transitioning to Medicare.  She does continue to smoke.  We discussed smoking cessation. She is working on reducing her smoking habit.  Was started on oxygen  previously but returned the oxygen  off several months ago.  Recently was seen in urgent care for COPD flare.  Started back on oxygen  due to exertional desaturations.  Unfortunately  patient did not bring her oxygen  in today with her O2 saturations were 70% on arrival required 2 L of oxygen  at rest to maintain O2 saturations greater than 88 to 90%.  Walk test today in the office shows O2 saturations at 85% on room air.  Requires 5 L of continuous flow oxygen  to maintain O2 saturations greater than 88 to 90%.  Does not qualify for pulsed/POC oxygen .  Patient was set up for pulmonary function testing that was completed today that shows worsening lung function with FEV1 at 36%, ratio 65, FVC 42%, DLCO 35%.    Allergies  Allergen Reactions   Sulfa Antibiotics Rash    She breaks out.      Immunization History  Administered Date(s) Administered   INFLUENZA, HIGH DOSE SEASONAL PF 06/07/2021   Influenza,inj,Quad PF,6+ Mos 04/06/2022   Influenza-Unspecified 03/16/2018, 03/17/2023   Moderna Sars-Covid-2 Vaccination 09/25/2019, 10/21/2019, 08/31/2020    History reviewed. No pertinent past medical history.  Tobacco History: Social History   Tobacco Use  Smoking Status Every Day   Current packs/day: 2.00   Average packs/day: 2.0 packs/day for 52.7 years (105.4 ttl pk-yrs)   Types: Cigarettes   Start date: 1973  Smokeless Tobacco Never  Tobacco Comments   Currently smoking one-one half pack daily  03/30/2024   Ready to quit: Not Answered Counseling given: Not Answered Tobacco comments: Currently smoking one-one half pack daily  03/30/2024   Outpatient Medications Prior to Visit  Medication Sig Dispense Refill   fexofenadine (ALLEGRA) 180 MG tablet Take 180 mg  by mouth daily.     fluticasone  (FLONASE) 50 MCG/ACT nasal spray Place 2 sprays into both nostrils daily.     Fluticasone -Umeclidin-Vilant (TRELEGY ELLIPTA ) 200-62.5-25 MCG/ACT AEPB Inhale 1 puff into the lungs daily. 1 each 11   furosemide (LASIX) 40 MG tablet Take 40 mg by mouth daily.     ibandronate (BONIVA) 150 MG tablet Take 150 mg by mouth every 30 (thirty) days.     levothyroxine (SYNTHROID) 137 MCG  tablet Take 137 mcg by mouth every morning.     losartan-hydrochlorothiazide (HYZAAR) 50-12.5 MG tablet Take 1 tablet by mouth daily.     montelukast (SINGULAIR) 10 MG tablet Take 10 mg by mouth at bedtime.     Multiple Vitamin (MULTIVITAMIN) tablet Take 1 tablet by mouth daily.     potassium chloride  (KLOR-CON  M) 10 MEQ tablet Take 1 tablet (10 mEq total) by mouth daily. 30 tablet 0   rosuvastatin (CRESTOR) 20 MG tablet Take 20 mg by mouth at bedtime.     Vitamin D, Ergocalciferol, (DRISDOL) 1.25 MG (50000 UNIT) CAPS capsule Take 50,000 Units by mouth once a week.     albuterol  (VENTOLIN  HFA) 108 (90 Base) MCG/ACT inhaler Inhale 1 puff into the lungs every 6 (six) hours as needed for wheezing or shortness of breath. Taking 200MCG DAILY     ipratropium-albuterol  (DUONEB) 0.5-2.5 (3) MG/3ML SOLN Take 3 mLs by nebulization every 6 (six) hours as needed.     No facility-administered medications prior to visit.     Review of Systems:   Constitutional:   No  weight loss, night sweats,  Fevers, chills,+ fatigue, or  lassitude.  HEENT:   No headaches,  Difficulty swallowing,  Tooth/dental problems, or  Sore throat,                No sneezing, itching, ear ache, nasal congestion, post nasal drip,   CV:  No chest pain,  Orthopnea, PND, swelling in lower extremities, anasarca, dizziness, palpitations, syncope.   GI  No heartburn, indigestion, abdominal pain, nausea, vomiting, diarrhea, change in bowel habits, loss of appetite, bloody stools.   Resp:  No chest wall deformity  Skin: no rash or lesions.  GU: no dysuria, change in color of urine, no urgency or frequency.  No flank pain, no hematuria   MS:  No joint pain or swelling.  No decreased range of motion.  No back pain.    Physical Exam  BP 108/72   Pulse 77   Temp 98.2 F (36.8 C)   Ht 5' 4 (1.626 m)   Wt 245 lb (111.1 kg)   SpO2 93%   BMI 42.05 kg/m   GEN: A/Ox3; pleasant , NAD, elderly, O2   HEENT:  [place]/AT,  ,  NOSE-clear, THROAT-clear, no lesions, no postnasal drip or exudate noted.   NECK:  Supple w/ fair ROM; no JVD; normal carotid impulses w/o bruits; no thyromegaly or nodules palpated; no lymphadenopathy.    RESP  Clear  P & A; w/o, wheezes/ rales/ or rhonchi. no accessory muscle use, no dullness to percussion  CARD:  RRR, no m/r/g, no peripheral edema, pulses intact, no cyanosis or clubbing.  GI:   Soft & nt; nml bowel sounds; no organomegaly or masses detected.   Musco: Warm bil, no deformities or joint swelling noted.   Neuro: alert, no focal deficits noted.    Skin: Warm, no lesions or rashes    Lab Results:  CBC    BNP No results found for:  BNP  ProBNP No results found for: PROBNP  Imaging: No results found.  Administration History     None          Latest Ref Rng & Units 03/30/2024    8:07 AM 09/13/2021    8:28 AM  PFT Results  FVC-Pre L 1.35  P 1.91   FVC-Predicted Pre % 42  P 58   FVC-Post L  1.88   FVC-Predicted Post %  57   Pre FEV1/FVC % % 65  P 73   Post FEV1/FCV % %  74   FEV1-Pre L 0.88  P 1.40   FEV1-Predicted Pre % 36  P 55   FEV1-Post L  1.39   DLCO uncorrected ml/min/mmHg 7.13  P 10.75   DLCO UNC% % 35  P 52   DLCO corrected ml/min/mmHg 7.13  P 10.75   DLCO COR %Predicted % 35  P 52   DLVA Predicted % 61  P 79   TLC L 4.44  P 4.43   TLC % Predicted % 87  P 86   RV % Predicted % 144  P 102     P Preliminary result    No results found for: NITRICOXIDE      Assessment & Plan:   No problem-specific Assessment & Plan notes found for this encounter.  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 Ongoing smoking contributes to the progression of her condition.  Patient is unable to afford Trelegy inhaler.  Prescribe Advair 500, one puff twice daily,  and Duoneb for nebulizer, three times daily.  Smoking cessation discussed in detail.  Chronic respiratory failure.  Patient is encouraged on oxygen  compliance.  Requires 2 L of oxygen  at rest and 5 L with oxygen  with activity.  Does not qualify for POC device.   Tobacco use disorder   She has a long history of heavy smoking, which contributes to the progression of COPD and emphysema. She is attempting to quit but continues to smoke, influenced by her husband's smoking habits. Smoking cessation is critical to prevent further decline in lung function. Encourage her to reduce smoking to below one pack per day. Provide information on smoking cessation resources, including 1-800-QUIT-NOW. Discuss potential use of nicotine replacement therapies such as lozenges and patches once smoking is reduced. Continue with the lung cancer CT chest screening program.  Plan  Patient Instructions  Finish Trelegy. Begin Adavir 500 1 puff Twice daily   Begin Duoneb Three times a day    Albuterol  inhaler As needed   Continue on Oxygen  2l/m at rest and 5l/m with activity.  Allergra daily as needed  Activity as tolerated .  Work on quitting /cutting back on smoking.  1800QUITNOW Chest CT screening program. -we will check with program to see next scan  Follow up with Dr. Geronimo or Kailand Seda NP in 3 months and As needed         Madelin Stank, NP 03/30/2024

## 2024-03-30 NOTE — Patient Instructions (Addendum)
 Finish Trelegy. Begin Adavir 500 1 puff Twice daily   Begin Duoneb Three times a day    Albuterol  inhaler As needed   Continue on Oxygen  2l/m at rest and 5l/m with activity.  Allergra daily as needed  Activity as tolerated .  Work on quitting /cutting back on smoking.  1800QUITNOW Chest CT screening program. -we will check with program to see next scan  Follow up with Dr. Geronimo or Leigha Olberding NP in 3 months and As needed

## 2024-03-30 NOTE — Telephone Encounter (Signed)
 Can we check to see if patient is enrolled with Lung Cancer screening program? Pt states she is enrolled but we aren't seeing anything in the patients chart. Pt had office visit with Tammy this morning. Can you please advise if patient can be enrolled and when next scan will be?  Thanks!

## 2024-03-31 ENCOUNTER — Other Ambulatory Visit: Payer: Self-pay

## 2024-03-31 DIAGNOSIS — Z87891 Personal history of nicotine dependence: Secondary | ICD-10-CM

## 2024-03-31 DIAGNOSIS — F17210 Nicotine dependence, cigarettes, uncomplicated: Secondary | ICD-10-CM

## 2024-03-31 DIAGNOSIS — Z122 Encounter for screening for malignant neoplasm of respiratory organs: Secondary | ICD-10-CM

## 2024-03-31 NOTE — Telephone Encounter (Signed)
 New order placed for 2025 annual LDCT and sent to scheduler to call for appointment

## 2024-04-14 ENCOUNTER — Ambulatory Visit (INDEPENDENT_AMBULATORY_CARE_PROVIDER_SITE_OTHER)
Admission: RE | Admit: 2024-04-14 | Discharge: 2024-04-14 | Disposition: A | Discharge status: Home / Self Care | Source: Ambulatory Visit | Attending: Acute Care | Admitting: Acute Care

## 2024-04-14 DIAGNOSIS — Z87891 Personal history of nicotine dependence: Secondary | ICD-10-CM

## 2024-04-14 DIAGNOSIS — F17210 Nicotine dependence, cigarettes, uncomplicated: Secondary | ICD-10-CM

## 2024-04-14 DIAGNOSIS — Z122 Encounter for screening for malignant neoplasm of respiratory organs: Secondary | ICD-10-CM | POA: Diagnosis not present

## 2024-04-20 ENCOUNTER — Other Ambulatory Visit: Payer: Self-pay | Admitting: Acute Care

## 2024-04-20 DIAGNOSIS — Z122 Encounter for screening for malignant neoplasm of respiratory organs: Secondary | ICD-10-CM

## 2024-04-20 DIAGNOSIS — F17210 Nicotine dependence, cigarettes, uncomplicated: Secondary | ICD-10-CM

## 2024-04-20 DIAGNOSIS — Z87891 Personal history of nicotine dependence: Secondary | ICD-10-CM

## 2024-04-25 DIAGNOSIS — J45909 Unspecified asthma, uncomplicated: Secondary | ICD-10-CM | POA: Diagnosis not present

## 2024-05-26 DIAGNOSIS — J45909 Unspecified asthma, uncomplicated: Secondary | ICD-10-CM | POA: Diagnosis not present

## 2024-06-03 DIAGNOSIS — K080 Exfoliation of teeth due to systemic causes: Secondary | ICD-10-CM | POA: Diagnosis not present

## 2024-06-15 ENCOUNTER — Ambulatory Visit: Admitting: Adult Health

## 2024-06-15 ENCOUNTER — Encounter: Payer: Self-pay | Admitting: Adult Health

## 2024-06-15 VITALS — BP 123/75 | HR 80 | Temp 97.8°F | Ht 64.0 in | Wt 248.2 lb

## 2024-06-15 DIAGNOSIS — J441 Chronic obstructive pulmonary disease with (acute) exacerbation: Secondary | ICD-10-CM

## 2024-06-15 DIAGNOSIS — Z23 Encounter for immunization: Secondary | ICD-10-CM | POA: Diagnosis not present

## 2024-06-15 DIAGNOSIS — J9611 Chronic respiratory failure with hypoxia: Secondary | ICD-10-CM | POA: Diagnosis not present

## 2024-06-15 DIAGNOSIS — Z720 Tobacco use: Secondary | ICD-10-CM | POA: Diagnosis not present

## 2024-06-15 DIAGNOSIS — J449 Chronic obstructive pulmonary disease, unspecified: Secondary | ICD-10-CM

## 2024-06-15 DIAGNOSIS — R0609 Other forms of dyspnea: Secondary | ICD-10-CM

## 2024-06-15 MED ORDER — TRELEGY ELLIPTA 100-62.5-25 MCG/ACT IN AEPB: 1.0000 | INHALATION_SPRAY | Freq: Every day | RESPIRATORY_TRACT | Status: AC

## 2024-06-15 MED ORDER — TRELEGY ELLIPTA 200-62.5-25 MCG/ACT IN AEPB: 1.0000 | INHALATION_SPRAY | Freq: Every day | RESPIRATORY_TRACT | 5 refills | Status: AC

## 2024-06-15 MED ORDER — PREDNISONE 20 MG PO TABS: 20.0000 mg | ORAL_TABLET | Freq: Every day | ORAL | 0 refills | Status: DC

## 2024-06-15 NOTE — Patient Instructions (Addendum)
 Prednisone  20mg  daily for 5 days.  Stop Advair.  Continue on Trelegy 1 puff daily, rinse after use.  Albuterol  inhaler/Duoneb As needed   Continue on Oxygen  2l/m at rest and 5l/m with activity.  Singulair, Flonase, Allergra daily as needed  Activity as tolerated .  Work on quitting /cutting back on smoking.  1800QUITNOW Flu shot today  Follow up with Dr. Geronimo or Hanna Aultman NP in 3 months and As needed   Please contact office for sooner follow up if symptoms do not improve or worsen or seek emergency care

## 2024-06-15 NOTE — Progress Notes (Signed)
 @Patient  ID: Lisa Navarro, female    DOB: Jun 05, 1956, 68 y.o.   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  Mild emphysema, incidental finding of atherosclerosis and calcifications on the aortic valve   2D echo August 03, 2021 showed EF at 60-65%, grade 1 diastolic dysfunction, right ventricular systolic function is normal right ventricular size is normal aortic valve is normal no regurgitation and no or aortic valve stenosis.   PFTs September 13, 2021 showed moderate restriction with an FEV1 at 54%, ratio 74, FVC 57%, no significant bronchodilator response, DLCO is at 52%  pulmonary function testing 03/30/24 - worsening lung function with FEV1 at 36%, ratio 65, FVC 42%, DLCO 35%.    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  History of Present Illness Lisa Navarro is a 68 year old female with COPD and emphysema who presents with worsening breathing .   She was previously on Trelegy, which she found more effective, but switched to Advair due to cost concerns. However, Advair causes significant side effects, making her feel 'sick as a dog.' She prefers Trelegy despite its cost and is considering switching back, as she is now on Medicare and can reassess her copay.  She denies any hemoptysis, chest pain, orOrthopnea.  Orthopnea.  At baseline she gets short of  breath with minimal activities.  Cannot walk any amount of long distance.  Currently has to get her groceries delivered.  As she cannot walk in the grocery store to shop.  She uses DuoNeb as needed.  She has been experiencing a flare-up in her COPD symptoms, including increased coughing and wheezing, particularly in cold air.   She continues to smoke.  We discussed smoking cessation.  She participates in the lung cancer CT screening program.  CT chest April 14, 2024 showed emphysema.  No suspicious pulmonary nodules.  She is currently managing her husband's care, who is on palliative care for kidney failure and congestive heart failure. This situation adds to her stress and limits her ability to manage her own health needs.  She takes Allegra and Singulair daily for allergies She has not received her flu shot this year due to personal circumstances but has had a pneumonia shot previously.     Allergies  Allergen Reactions   Sulfa Antibiotics Rash    She breaks out.      Immunization History  Administered Date(s) Administered   INFLUENZA, HIGH DOSE SEASONAL PF 06/07/2021   Influenza,inj,Quad PF,6+ Mos 04/06/2022   Influenza-Unspecified 03/16/2018, 03/17/2023   Moderna Sars-Covid-2 Vaccination 09/25/2019, 10/21/2019, 08/31/2020    No past medical history on file.  Tobacco History: Social History   Tobacco Use  Smoking Status Every Day   Current packs/day: 2.00   Average packs/day: 2.0 packs/day for 52.9 years (105.8 ttl pk-yrs)   Types: Cigarettes   Start date: 56  Smokeless  Tobacco Never  Tobacco Comments   Currently smoking one-one half pack daily  06/15/24   Ready to quit: Not Answered Counseling given: Not Answered Tobacco comments: Currently smoking one-one half pack daily  06/15/24   Outpatient Medications Prior to Visit  Medication Sig Dispense Refill   albuterol  (VENTOLIN  HFA) 108 (90 Base) MCG/ACT inhaler Inhale 1-2 puffs into the lungs every 6 (six)  hours as needed for wheezing or shortness of breath. Taking DAILY 1 each 4   fexofenadine (ALLEGRA) 180 MG tablet Take 180 mg by mouth daily.     fluticasone  (FLONASE) 50 MCG/ACT nasal spray Place 2 sprays into both nostrils daily.     Fluticasone -Umeclidin-Vilant (TRELEGY ELLIPTA ) 200-62.5-25 MCG/ACT AEPB 1 sample provided     furosemide (LASIX) 40 MG tablet Take 40 mg by mouth daily.     ibandronate (BONIVA) 150 MG tablet Take 150 mg by mouth every 30 (thirty) days.     ipratropium-albuterol  (DUONEB) 0.5-2.5 (3) MG/3ML SOLN Take 3 mLs by nebulization every 6 (six) hours as needed.     levothyroxine (SYNTHROID) 137 MCG tablet Take 137 mcg by mouth every morning.     losartan-hydrochlorothiazide (HYZAAR) 50-12.5 MG tablet Take 1 tablet by mouth daily.     montelukast (SINGULAIR) 10 MG tablet Take 10 mg by mouth at bedtime.     Multiple Vitamin (MULTIVITAMIN) tablet Take 1 tablet by mouth daily.     potassium chloride  (KLOR-CON  M) 10 MEQ tablet Take 1 tablet (10 mEq total) by mouth daily. 30 tablet 0   rosuvastatin (CRESTOR) 20 MG tablet Take 20 mg by mouth at bedtime.     fluticasone -salmeterol (ADVAIR) 500-50 MCG/ACT AEPB Inhale 1 puff into the lungs in the morning and at bedtime. (Patient not taking: Reported on 06/15/2024) 1 each 11   Vitamin D, Ergocalciferol, (DRISDOL) 1.25 MG (50000 UNIT) CAPS capsule Take 50,000 Units by mouth once a week. (Patient not taking: Reported on 06/15/2024)     No facility-administered medications prior to visit.     Review of Systems:   Constitutional:   No  weight loss, night sweats,  Fevers, chills, +fatigue, or  lassitude.  HEENT:   No headaches,  Difficulty swallowing,  Tooth/dental problems, or  Sore throat,                No sneezing, itching, ear ache, nasal congestion, post nasal drip,   CV:  No chest pain,  Orthopnea, PND, swelling in lower extremities, anasarca, dizziness, palpitations, syncope.   GI  No heartburn, indigestion, abdominal  pain, nausea, vomiting, diarrhea, change in bowel habits, loss of appetite, bloody stools.   Resp: .  No chest wall deformity  Skin: no rash or lesions.  GU: no dysuria, change in color of urine, no urgency or frequency.  No flank pain, no hematuria   MS:  No joint pain or swelling.  No decreased range of motion.  No back pain.    Physical Exam  BP 123/75   Pulse 80   Temp 97.8 F (36.6 C)   Ht 5' 4 (1.626 m) Comment: Per pt  Wt 248 lb 3.2 oz (112.6 kg)   SpO2 (!) 89% Comment: 4l pULSE  BMI 42.60 kg/m   GEN: A/Ox3; pleasant , NAD, well nourished    HEENT:  [place]/AT,  NOSE-clear, THROAT-clear, no lesions, no postnasal drip or exudate noted.   NECK:  Supple w/ fair ROM; no JVD; normal carotid impulses w/o bruits; no thyromegaly or nodules palpated; no lymphadenopathy.  RESP few faint wheezes.  no accessory muscle use, no dullness to percussion  CARD:  RRR, no m/r/g, no peripheral edema, pulses intact, no cyanosis or clubbing.  GI:   Soft & nt; nml bowel sounds; no organomegaly or masses detected.   Musco: Warm bil, no deformities or joint swelling noted.   Neuro: alert, no focal deficits noted.    Skin: Warm, no lesions or rashes    Lab Results:Reviewed 06/15/2024   CBC   BNP No results found for: BNP  ProBNP No results found for: PROBNP  Imaging: No results found.  Administration History     None          Latest Ref Rng & Units 03/30/2024    8:07 AM 09/13/2021    8:28 AM  PFT Results  FVC-Pre L 1.35  1.91   FVC-Predicted Pre % 42  58   FVC-Post L  1.88   FVC-Predicted Post %  57   Pre FEV1/FVC % % 65  73   Post FEV1/FCV % %  74   FEV1-Pre L 0.88  1.40   FEV1-Predicted Pre % 36  55   FEV1-Post L  1.39   DLCO uncorrected ml/min/mmHg 7.13  10.75   DLCO UNC% % 35  52   DLCO corrected ml/min/mmHg 7.13  10.75   DLCO COR %Predicted % 35  52   DLVA Predicted % 61  79   TLC L 4.44  4.43   TLC % Predicted % 87  86   RV % Predicted % 144  102      No results found for: NITRICOXIDE      No data to display              Assessment & Plan:   Assessment and Plan Assessment & Plan Chronic obstructive pulmonary disease with acute exacerbation and emphysema - treat with steroid burst.  Prescribed prednisone  20 mg for 5 days for COPD exacerbation.  Change maintenance therapy from Advair to Trelegy.  May use albuterol  or DuoNeb as needed.   Chronic allergic rhinitis-compensated Continue on Singulair, Flonase and Allegra daily as needed  Dyspnea with exertion-ongoing progressive dyspnea with exertion, decreased activity tolerance -patient has multiple risk factors including ongoing smoking, morbid obesity, hyperlipidemia, atherosclerosis noted on CT imaging.  Will refer to cardiology for further evaluation of possible underlying cardiac disease.     Chronic respiratory failure -compensated on oxygen .  Patient was previously on oxygen  in the past.  Dependence on supplemental oxygen .  Recent exacerbation in June patient was restarted on oxygen  currently required 2 L at rest and 5 L with activity.   Tobacco use  -smoking cessation discussed in detail Continues to smoke, removing oxygen  during smoking outside.    Plan  Patient Instructions  Prednisone  20mg  daily for 5 days.  Stop Advair.  Continue on Trelegy 1 puff daily, rinse after use.  Albuterol  inhaler/Duoneb As needed   Continue on Oxygen  2l/m at rest and 5l/m with activity.  Singulair, Flonase, Allergra daily as needed  Activity as tolerated .  Work on quitting /cutting back on smoking.  1800QUITNOW Flu shot today  Follow up with Dr. Geronimo or Chrishun Scheer NP in 3 months and As needed   Please contact office for sooner follow up if symptoms do not improve or worsen or seek emergency care             Belisa Eichholz, NP 06/15/2024  I spent   36 minutes dedicated to the care  of this patient on the date of this encounter to include pre-visit review of records,  face-to-face time with the patient discussing conditions above, post visit ordering of testing, clinical documentation with the electronic health record, making appropriate referrals as documented, and communicating necessary findings to members of the patients care team.

## 2024-06-19 NOTE — Progress Notes (Signed)
 Lisa Navarro                                          MRN: [id number]   06/19/2024   The [facility] Quality Team Specialist reviewed this patient medical record for the purposes of chart review for care gap closure. The following were reviewed: chart review for care gap closure-kidney health evaluation for diabetes:eGFR  and uACR.    [facility] Quality Team

## 2024-06-25 DIAGNOSIS — J449 Chronic obstructive pulmonary disease, unspecified: Secondary | ICD-10-CM | POA: Diagnosis not present

## 2024-06-25 DIAGNOSIS — J45909 Unspecified asthma, uncomplicated: Secondary | ICD-10-CM | POA: Diagnosis not present

## 2024-07-22 DIAGNOSIS — I342 Nonrheumatic mitral (valve) stenosis: Secondary | ICD-10-CM | POA: Diagnosis not present

## 2024-07-22 DIAGNOSIS — I517 Cardiomegaly: Secondary | ICD-10-CM | POA: Diagnosis not present

## 2024-07-22 DIAGNOSIS — I371 Nonrheumatic pulmonary valve insufficiency: Secondary | ICD-10-CM | POA: Diagnosis not present

## 2024-07-22 DIAGNOSIS — I361 Nonrheumatic tricuspid (valve) insufficiency: Secondary | ICD-10-CM | POA: Diagnosis not present

## 2024-07-22 DIAGNOSIS — I351 Nonrheumatic aortic (valve) insufficiency: Secondary | ICD-10-CM | POA: Diagnosis not present

## 2024-08-17 ENCOUNTER — Inpatient Hospital Stay: Admitting: Adult Health

## 2024-08-18 ENCOUNTER — Other Ambulatory Visit: Payer: Self-pay

## 2024-08-18 ENCOUNTER — Ambulatory Visit: Admitting: Cardiology

## 2024-08-18 DIAGNOSIS — F4321 Adjustment disorder with depressed mood: Secondary | ICD-10-CM | POA: Insufficient documentation

## 2024-08-18 DIAGNOSIS — I5032 Chronic diastolic (congestive) heart failure: Secondary | ICD-10-CM | POA: Insufficient documentation

## 2024-08-18 DIAGNOSIS — R0683 Snoring: Secondary | ICD-10-CM | POA: Insufficient documentation

## 2024-08-18 DIAGNOSIS — Z6841 Body Mass Index (BMI) 40.0 and over, adult: Secondary | ICD-10-CM | POA: Insufficient documentation

## 2024-08-18 DIAGNOSIS — Z8701 Personal history of pneumonia (recurrent): Secondary | ICD-10-CM | POA: Insufficient documentation

## 2024-08-18 DIAGNOSIS — J439 Emphysema, unspecified: Secondary | ICD-10-CM | POA: Insufficient documentation

## 2024-08-24 ENCOUNTER — Ambulatory Visit: Admitting: Adult Health

## 2024-08-25 ENCOUNTER — Ambulatory Visit: Admitting: Cardiology

## 2024-09-21 ENCOUNTER — Ambulatory Visit: Admitting: Adult Health
